# Patient Record
Sex: Female | Born: 1975 | Race: Black or African American | Hispanic: No | Marital: Single | Smoking: Former smoker
Health system: Southern US, Community
[De-identification: ages and names within clinical notes are randomized; demographics above are authoritative.]

## PROBLEM LIST (undated history)

## (undated) ENCOUNTER — Emergency Department (HOSPITAL_COMMUNITY): Discharge status: Absent without leave | Payer: Self-pay

## (undated) DIAGNOSIS — A5909 Other urogenital trichomoniasis: Secondary | ICD-10-CM

## (undated) DIAGNOSIS — O98319 Other infections with a predominantly sexual mode of transmission complicating pregnancy, unspecified trimester: Secondary | ICD-10-CM

## (undated) DIAGNOSIS — N390 Urinary tract infection, site not specified: Secondary | ICD-10-CM

## (undated) DIAGNOSIS — F329 Major depressive disorder, single episode, unspecified: Secondary | ICD-10-CM

## (undated) DIAGNOSIS — B9689 Other specified bacterial agents as the cause of diseases classified elsewhere: Secondary | ICD-10-CM

## (undated) DIAGNOSIS — N760 Acute vaginitis: Secondary | ICD-10-CM

## (undated) DIAGNOSIS — E049 Nontoxic goiter, unspecified: Secondary | ICD-10-CM

## (undated) DIAGNOSIS — F32A Depression, unspecified: Secondary | ICD-10-CM

## (undated) DIAGNOSIS — T7840XA Allergy, unspecified, initial encounter: Secondary | ICD-10-CM

## (undated) DIAGNOSIS — A568 Sexually transmitted chlamydial infection of other sites: Secondary | ICD-10-CM

## (undated) HISTORY — DX: Depression, unspecified: F32.A

## (undated) HISTORY — DX: Allergy, unspecified, initial encounter: T78.40XA

## (undated) HISTORY — DX: Major depressive disorder, single episode, unspecified: F32.9

---

## 1997-10-02 ENCOUNTER — Inpatient Hospital Stay (HOSPITAL_COMMUNITY): Admission: AD | Admit: 1997-10-02 | Discharge: 1997-10-02 | Payer: Self-pay | Admitting: Obstetrics & Gynecology

## 1999-07-13 ENCOUNTER — Inpatient Hospital Stay (HOSPITAL_COMMUNITY): Admission: AD | Admit: 1999-07-13 | Discharge: 1999-07-13 | Payer: Self-pay | Admitting: Pediatrics

## 2000-06-27 ENCOUNTER — Inpatient Hospital Stay (HOSPITAL_COMMUNITY): Admission: AD | Admit: 2000-06-27 | Discharge: 2000-06-27 | Payer: Self-pay | Admitting: *Deleted

## 2000-09-27 ENCOUNTER — Inpatient Hospital Stay (HOSPITAL_COMMUNITY): Admission: AD | Admit: 2000-09-27 | Discharge: 2000-09-27 | Payer: Self-pay | Admitting: Obstetrics

## 2000-11-30 ENCOUNTER — Ambulatory Visit (HOSPITAL_COMMUNITY): Admission: RE | Admit: 2000-11-30 | Discharge: 2000-11-30 | Payer: Self-pay | Admitting: *Deleted

## 2000-12-17 ENCOUNTER — Inpatient Hospital Stay (HOSPITAL_COMMUNITY): Admission: AD | Admit: 2000-12-17 | Discharge: 2000-12-17 | Payer: Self-pay | Admitting: *Deleted

## 2000-12-26 ENCOUNTER — Ambulatory Visit (HOSPITAL_COMMUNITY): Admission: RE | Admit: 2000-12-26 | Discharge: 2000-12-26 | Payer: Self-pay | Admitting: Obstetrics

## 2001-01-31 ENCOUNTER — Inpatient Hospital Stay (HOSPITAL_COMMUNITY): Admission: AD | Admit: 2001-01-31 | Discharge: 2001-01-31 | Payer: Self-pay | Admitting: Obstetrics

## 2001-01-31 ENCOUNTER — Encounter: Payer: Self-pay | Admitting: Obstetrics

## 2001-02-01 ENCOUNTER — Encounter (INDEPENDENT_AMBULATORY_CARE_PROVIDER_SITE_OTHER): Payer: Self-pay

## 2001-02-01 ENCOUNTER — Inpatient Hospital Stay (HOSPITAL_COMMUNITY): Admission: AD | Admit: 2001-02-01 | Discharge: 2001-02-03 | Payer: Self-pay | Admitting: Obstetrics & Gynecology

## 2001-02-02 HISTORY — PX: TUBAL LIGATION: SHX77

## 2001-12-26 ENCOUNTER — Inpatient Hospital Stay (HOSPITAL_COMMUNITY): Admission: AD | Admit: 2001-12-26 | Discharge: 2001-12-26 | Payer: Self-pay | Admitting: Obstetrics and Gynecology

## 2002-04-10 ENCOUNTER — Emergency Department (HOSPITAL_COMMUNITY): Admission: EM | Admit: 2002-04-10 | Discharge: 2002-04-10 | Payer: Self-pay | Admitting: Emergency Medicine

## 2002-09-08 ENCOUNTER — Emergency Department (HOSPITAL_COMMUNITY): Admission: EM | Admit: 2002-09-08 | Discharge: 2002-09-08 | Payer: Self-pay | Admitting: Emergency Medicine

## 2002-12-10 ENCOUNTER — Emergency Department (HOSPITAL_COMMUNITY): Admission: EM | Admit: 2002-12-10 | Discharge: 2002-12-10 | Payer: Self-pay

## 2002-12-30 ENCOUNTER — Inpatient Hospital Stay (HOSPITAL_COMMUNITY): Admission: AD | Admit: 2002-12-30 | Discharge: 2002-12-30 | Payer: Self-pay | Admitting: Obstetrics and Gynecology

## 2003-05-07 ENCOUNTER — Inpatient Hospital Stay (HOSPITAL_COMMUNITY): Admission: AD | Admit: 2003-05-07 | Discharge: 2003-05-07 | Payer: Self-pay | Admitting: Obstetrics and Gynecology

## 2003-05-16 ENCOUNTER — Encounter: Admission: RE | Admit: 2003-05-16 | Discharge: 2003-05-16 | Payer: Self-pay | Admitting: Family Medicine

## 2003-07-11 ENCOUNTER — Encounter: Admission: RE | Admit: 2003-07-11 | Discharge: 2003-07-11 | Payer: Self-pay | Admitting: Family Medicine

## 2003-08-05 ENCOUNTER — Encounter: Admission: RE | Admit: 2003-08-05 | Discharge: 2003-08-05 | Payer: Self-pay | Admitting: Pediatrics

## 2003-09-04 ENCOUNTER — Inpatient Hospital Stay (HOSPITAL_COMMUNITY): Admission: AD | Admit: 2003-09-04 | Discharge: 2003-09-04 | Payer: Self-pay | Admitting: Obstetrics & Gynecology

## 2004-02-24 ENCOUNTER — Ambulatory Visit: Payer: Self-pay | Admitting: Family Medicine

## 2004-06-10 ENCOUNTER — Ambulatory Visit: Payer: Self-pay | Admitting: Family Medicine

## 2004-06-10 ENCOUNTER — Other Ambulatory Visit: Admission: RE | Admit: 2004-06-10 | Discharge: 2004-06-10 | Payer: Self-pay | Admitting: Family Medicine

## 2004-06-26 ENCOUNTER — Emergency Department (HOSPITAL_COMMUNITY): Admission: EM | Admit: 2004-06-26 | Discharge: 2004-06-26 | Payer: Self-pay | Admitting: Family Medicine

## 2005-11-19 ENCOUNTER — Emergency Department (HOSPITAL_COMMUNITY): Admission: EM | Admit: 2005-11-19 | Discharge: 2005-11-19 | Payer: Self-pay | Admitting: Emergency Medicine

## 2006-01-30 ENCOUNTER — Encounter (INDEPENDENT_AMBULATORY_CARE_PROVIDER_SITE_OTHER): Payer: Self-pay | Admitting: *Deleted

## 2006-01-30 LAB — CONVERTED CEMR LAB

## 2006-02-27 ENCOUNTER — Ambulatory Visit: Payer: Self-pay | Admitting: Family Medicine

## 2006-03-08 ENCOUNTER — Encounter: Admission: RE | Admit: 2006-03-08 | Discharge: 2006-03-08 | Payer: Self-pay | Admitting: Sports Medicine

## 2006-03-17 ENCOUNTER — Encounter: Admission: RE | Admit: 2006-03-17 | Discharge: 2006-03-17 | Payer: Self-pay | Admitting: Sports Medicine

## 2006-03-20 ENCOUNTER — Ambulatory Visit: Payer: Self-pay | Admitting: Sports Medicine

## 2006-05-16 ENCOUNTER — Ambulatory Visit: Payer: Self-pay | Admitting: Family Medicine

## 2006-06-29 DIAGNOSIS — R12 Heartburn: Secondary | ICD-10-CM | POA: Insufficient documentation

## 2006-06-29 DIAGNOSIS — F17200 Nicotine dependence, unspecified, uncomplicated: Secondary | ICD-10-CM | POA: Insufficient documentation

## 2006-06-29 DIAGNOSIS — E049 Nontoxic goiter, unspecified: Secondary | ICD-10-CM | POA: Insufficient documentation

## 2006-06-30 ENCOUNTER — Encounter (INDEPENDENT_AMBULATORY_CARE_PROVIDER_SITE_OTHER): Payer: Self-pay | Admitting: *Deleted

## 2006-08-04 ENCOUNTER — Telehealth: Payer: Self-pay | Admitting: *Deleted

## 2006-08-08 ENCOUNTER — Ambulatory Visit: Payer: Self-pay | Admitting: Family Medicine

## 2006-08-08 ENCOUNTER — Encounter (INDEPENDENT_AMBULATORY_CARE_PROVIDER_SITE_OTHER): Payer: Self-pay | Admitting: Family Medicine

## 2006-08-08 DIAGNOSIS — N898 Other specified noninflammatory disorders of vagina: Secondary | ICD-10-CM

## 2006-08-08 LAB — CONVERTED CEMR LAB
Chlamydia, DNA Probe: NEGATIVE
GC Probe Amp, Genital: NEGATIVE

## 2006-09-06 ENCOUNTER — Encounter (INDEPENDENT_AMBULATORY_CARE_PROVIDER_SITE_OTHER): Payer: Self-pay | Admitting: Family Medicine

## 2006-09-06 ENCOUNTER — Ambulatory Visit: Payer: Self-pay | Admitting: Family Medicine

## 2006-09-06 DIAGNOSIS — J301 Allergic rhinitis due to pollen: Secondary | ICD-10-CM | POA: Insufficient documentation

## 2006-09-06 LAB — CONVERTED CEMR LAB
Bilirubin Urine: NEGATIVE
Blood in Urine, dipstick: NEGATIVE
Glucose, Urine, Semiquant: NEGATIVE
Ketones, urine, test strip: NEGATIVE
Nitrite: NEGATIVE
Specific Gravity, Urine: >=1.03
Urobilinogen, UA: 0.2
WBC Urine, dipstick: NEGATIVE
Whiff Test: NEGATIVE
pH: 6

## 2006-09-07 ENCOUNTER — Telehealth (INDEPENDENT_AMBULATORY_CARE_PROVIDER_SITE_OTHER): Payer: Self-pay | Admitting: *Deleted

## 2006-10-05 ENCOUNTER — Encounter (INDEPENDENT_AMBULATORY_CARE_PROVIDER_SITE_OTHER): Payer: Self-pay | Admitting: Interventional Radiology

## 2006-10-05 ENCOUNTER — Other Ambulatory Visit: Admission: RE | Admit: 2006-10-05 | Discharge: 2006-10-05 | Payer: Self-pay | Admitting: Interventional Radiology

## 2006-10-05 ENCOUNTER — Encounter: Admission: RE | Admit: 2006-10-05 | Discharge: 2006-10-05 | Payer: Self-pay | Admitting: Internal Medicine

## 2006-10-11 ENCOUNTER — Ambulatory Visit: Payer: Self-pay | Admitting: Sports Medicine

## 2006-11-09 ENCOUNTER — Encounter (INDEPENDENT_AMBULATORY_CARE_PROVIDER_SITE_OTHER): Payer: Self-pay | Admitting: Family Medicine

## 2006-11-13 ENCOUNTER — Ambulatory Visit: Payer: Self-pay | Admitting: Family Medicine

## 2006-11-15 ENCOUNTER — Ambulatory Visit: Payer: Self-pay | Admitting: Family Medicine

## 2007-05-14 ENCOUNTER — Ambulatory Visit: Payer: Self-pay | Admitting: Sports Medicine

## 2007-05-14 LAB — CONVERTED CEMR LAB: Rapid Strep: NEGATIVE

## 2007-12-24 ENCOUNTER — Telehealth (INDEPENDENT_AMBULATORY_CARE_PROVIDER_SITE_OTHER): Payer: Self-pay | Admitting: *Deleted

## 2007-12-25 ENCOUNTER — Other Ambulatory Visit: Admission: RE | Admit: 2007-12-25 | Discharge: 2007-12-25 | Payer: Self-pay | Admitting: Family Medicine

## 2007-12-25 ENCOUNTER — Ambulatory Visit: Payer: Self-pay | Admitting: Family Medicine

## 2007-12-25 LAB — CONVERTED CEMR LAB
Pap Smear: NORMAL
Whiff Test: POSITIVE

## 2007-12-27 ENCOUNTER — Encounter: Payer: Self-pay | Admitting: Family Medicine

## 2007-12-27 ENCOUNTER — Ambulatory Visit: Payer: Self-pay | Admitting: Family Medicine

## 2007-12-28 ENCOUNTER — Encounter (INDEPENDENT_AMBULATORY_CARE_PROVIDER_SITE_OTHER): Payer: Self-pay | Admitting: *Deleted

## 2008-03-17 ENCOUNTER — Telehealth: Payer: Self-pay | Admitting: *Deleted

## 2008-03-17 ENCOUNTER — Ambulatory Visit: Payer: Self-pay | Admitting: Family Medicine

## 2008-03-17 ENCOUNTER — Emergency Department (HOSPITAL_COMMUNITY): Admission: EM | Admit: 2008-03-17 | Discharge: 2008-03-17 | Payer: Self-pay | Admitting: Emergency Medicine

## 2008-03-17 LAB — CONVERTED CEMR LAB: Rapid Strep: NEGATIVE

## 2008-08-19 ENCOUNTER — Ambulatory Visit: Payer: Self-pay | Admitting: Family Medicine

## 2008-08-19 ENCOUNTER — Telehealth: Payer: Self-pay | Admitting: Family Medicine

## 2008-08-19 LAB — CONVERTED CEMR LAB: Whiff Test: NEGATIVE

## 2008-08-28 ENCOUNTER — Telehealth: Payer: Self-pay | Admitting: *Deleted

## 2008-09-25 ENCOUNTER — Telehealth: Payer: Self-pay | Admitting: Family Medicine

## 2010-03-06 ENCOUNTER — Emergency Department (HOSPITAL_COMMUNITY): Admission: EM | Admit: 2010-03-06 | Discharge: 2010-03-06 | Payer: Self-pay | Admitting: Family Medicine

## 2010-05-13 ENCOUNTER — Emergency Department (HOSPITAL_COMMUNITY)
Admission: EM | Admit: 2010-05-13 | Discharge: 2010-05-13 | Payer: Self-pay | Source: Home / Self Care | Admitting: Family Medicine

## 2010-05-17 LAB — URINE CULTURE
Colony Count: >=100000
Culture  Setup Time: 201201122126

## 2010-05-17 LAB — WET PREP, GENITAL: Yeast Wet Prep HPF POC: NONE SEEN

## 2010-05-17 LAB — POCT URINALYSIS DIPSTICK
Bilirubin Urine: NEGATIVE
Hgb urine dipstick: NEGATIVE
Ketones, ur: 15 mg/dL — AB
Nitrite: POSITIVE — AB
Protein, ur: NEGATIVE mg/dL
Specific Gravity, Urine: 1.025 (ref 1.005–1.030)
Urine Glucose, Fasting: NEGATIVE mg/dL
Urobilinogen, UA: 0.2 mg/dL (ref 0.0–1.0)
pH: 5.5 (ref 5.0–8.0)

## 2010-05-17 LAB — GC/CHLAMYDIA PROBE AMP, GENITAL
Chlamydia, DNA Probe: NEGATIVE
GC Probe Amp, Genital: NEGATIVE

## 2010-05-17 LAB — POCT PREGNANCY, URINE: Preg Test, Ur: NEGATIVE

## 2010-07-13 LAB — POCT URINALYSIS DIPSTICK
Bilirubin Urine: NEGATIVE
Glucose, UA: NEGATIVE mg/dL
Hgb urine dipstick: NEGATIVE
Ketones, ur: NEGATIVE mg/dL
Nitrite: NEGATIVE
Protein, ur: NEGATIVE mg/dL
Specific Gravity, Urine: 1.02 (ref 1.005–1.030)
Urobilinogen, UA: 0.2 mg/dL (ref 0.0–1.0)
pH: 6.5 (ref 5.0–8.0)

## 2010-07-13 LAB — GC/CHLAMYDIA PROBE AMP, GENITAL
Chlamydia, DNA Probe: NEGATIVE
GC Probe Amp, Genital: NEGATIVE

## 2010-07-13 LAB — WET PREP, GENITAL
Trich, Wet Prep: NONE SEEN
Yeast Wet Prep HPF POC: NONE SEEN

## 2010-07-13 LAB — POCT PREGNANCY, URINE: Preg Test, Ur: NEGATIVE

## 2010-09-17 NOTE — Op Note (Signed)
[facility]  Patient:    Kaitlyn Rasmussen, Kaitlyn Rasmussen Visit Number: [id number] MRN: [id number]          Service Type: OBS Location: 910A [id number] [id number] Attending Physician:  Antionette Char Proc. Date: 01/31/01 Admit Date:  02/01/2001 Discharge Date: 02/03/2001                             Operative Report  PREOPERATIVE DIAGNOSIS:       Desire for surgical sterilization.  POSTOPERATIVE DIAGNOSIS:      Desire for surgical sterilization.  OPERATION:                    Modified bilateral [place] tubal ligation.  SURGEON:                      Conni Elliot, M.D.  ASSISTANT:                    Ed Blalock. Burnadette Peter, M.D.  ANESTHESIA:                   Continuous lumbar epidural.  DESCRIPTION OF PROCEDURE:     After bringing continuous lumbar epidural anesthetic to an operative level, the patient supine; the abdomen was prepped and draped in a sterile fashion.  A periumbilical incision was made and the incision made through the skin and the layers of fascia.  The peritoneal cavity was entered.  The right fallopian tube was identified, grasped with a Babcock clamp, followed to its fimbriated end.  A segment of tube was brought into the operative field, doubly suture ligated, approximately 1.5-2 cm segment was excised.  Hemostasis adequate.  A similar procedure was done on the left.  Hemostasis was again adequate.  Anterior peritoneum, fascia, subcu, and skin were closed in routine fashion.  Estimated blood loss less than 10 cc.  Instrument counts correct. Attending Physician:  Antionette Char DD:  02/22/01 TD:  02/24/01 Job: [id number]

## 2011-03-04 ENCOUNTER — Inpatient Hospital Stay (INDEPENDENT_AMBULATORY_CARE_PROVIDER_SITE_OTHER)
Admission: RE | Admit: 2011-03-04 | Discharge: 2011-03-04 | Disposition: A | Discharge status: Home / Self Care | Payer: Medicaid Other | Source: Ambulatory Visit | Attending: Family Medicine | Admitting: Family Medicine

## 2011-03-04 DIAGNOSIS — A5901 Trichomonal vulvovaginitis: Secondary | ICD-10-CM

## 2011-03-04 LAB — POCT URINALYSIS DIP (DEVICE)
Bilirubin Urine: NEGATIVE
Glucose, UA: NEGATIVE mg/dL
Hgb urine dipstick: NEGATIVE
Ketones, ur: NEGATIVE mg/dL
Leukocytes, UA: NEGATIVE
Nitrite: NEGATIVE
Protein, ur: NEGATIVE mg/dL
Specific Gravity, Urine: 1.02 (ref 1.005–1.030)
Urobilinogen, UA: 0.2 mg/dL (ref 0.0–1.0)
pH: 7 (ref 5.0–8.0)

## 2011-03-04 LAB — POCT PREGNANCY, URINE: Preg Test, Ur: NEGATIVE

## 2011-03-04 LAB — WET PREP, GENITAL: Trich, Wet Prep: NONE SEEN

## 2011-03-05 LAB — GC/CHLAMYDIA PROBE AMP, GENITAL
Chlamydia, DNA Probe: NEGATIVE
GC Probe Amp, Genital: NEGATIVE

## 2011-03-07 NOTE — ED Notes (Signed)
GC neg., Chlamydia neg., Wet prep: many clue cells, few WBC's.  Pt. adequately treated with Flagyl.

## 2011-07-22 ENCOUNTER — Encounter (HOSPITAL_COMMUNITY): Payer: Self-pay

## 2011-07-22 ENCOUNTER — Emergency Department (HOSPITAL_COMMUNITY)
Admission: EM | Admit: 2011-07-22 | Discharge: 2011-07-22 | Disposition: A | Discharge status: Home / Self Care | Payer: Medicaid Other | Source: Home / Self Care | Attending: Family Medicine | Admitting: Family Medicine

## 2011-07-22 DIAGNOSIS — N760 Acute vaginitis: Secondary | ICD-10-CM

## 2011-07-22 DIAGNOSIS — M278 Other specified diseases of jaws: Secondary | ICD-10-CM

## 2011-07-22 DIAGNOSIS — M270 Developmental disorders of jaws: Secondary | ICD-10-CM

## 2011-07-22 DIAGNOSIS — M545 Low back pain: Secondary | ICD-10-CM

## 2011-07-22 LAB — POCT URINALYSIS DIP (DEVICE)
Glucose, UA: NEGATIVE mg/dL
Ketones, ur: 40 mg/dL — AB
Protein, ur: 100 mg/dL — AB
Urobilinogen, UA: 0.2 mg/dL (ref 0.0–1.0)

## 2011-07-22 LAB — WET PREP, GENITAL: Yeast Wet Prep HPF POC: NONE SEEN

## 2011-07-22 MED ORDER — METRONIDAZOLE 500 MG PO TABS: 500.0000 mg | ORAL_TABLET | Freq: Two times a day (BID) | ORAL | Status: AC

## 2011-07-22 MED ORDER — IBUPROFEN 600 MG PO TABS: 600.0000 mg | ORAL_TABLET | Freq: Three times a day (TID) | ORAL | Status: AC | PRN

## 2011-07-22 MED ORDER — FLUCONAZOLE 150 MG PO TABS: ORAL_TABLET | ORAL | Status: DC

## 2011-07-22 NOTE — Discharge Instructions (Signed)
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  Usually just a normal finding. Can read about it over the Internet. No needed treatment unless growing and frequent ulceration.  Take the prescribed medications as instructed. Can do back exercises as per her handout. We will contact you if any abnormal results of the pending tests. Return if worsening pain or symptoms despite following treatment.   Back Exercises Back exercises help treat and prevent back injuries. The goal of back exercises is to increase the strength of your abdominal and back muscles and the flexibility of your back. These exercises should be started when you no longer have back pain. Back exercises include:  Pelvic Tilt. Lie on your back with your knees bent. Tilt your pelvis until the lower part of your back is against the floor. Hold this position 5 to 10 sec and repeat 5 to 10 times.   Knee to Chest. Pull first 1 knee up against your chest and hold for 20 to 30 seconds, repeat this with the other knee, and then both knees. This may be done with the other leg straight or bent, whichever feels better.   Sit-Ups or Curl-Ups. Bend your knees 90 degrees. Start with tilting your pelvis, and do a partial, slow sit-up, lifting your trunk only 30 to 45 degrees off the floor. Take at least 2 to 3 seconds for each sit-up. Do not do sit-ups with your knees out straight. If partial sit-ups are difficult, simply do the above but with only tightening your abdominal muscles and holding it as directed.   Hip-Lift. Lie on your back with your knees flexed 90 degrees. Push down with your feet and shoulders as you raise your hips a couple inches off the floor; hold for 10 seconds, repeat 5 to 10 times.   Back arches. Lie on your stomach, propping yourself up on bent elbows. Slowly press on your hands, causing an arch in your low back. Repeat 3 to 5 times. Any initial stiffness and discomfort should lessen with repetition over time.    Shoulder-Lifts. Lie face down with arms beside your body. Keep hips and torso pressed to floor as you slowly lift your head and shoulders off the floor.  Do not overdo your exercises, especially in the beginning. Exercises may cause you some mild back discomfort which lasts for a few minutes; however, if the pain is more severe, or lasts for more than 15 minutes, do not continue exercises until you see your caregiver. Improvement with exercise therapy for back problems is slow.  See your caregivers for assistance with developing a proper back exercise program. Document Released: 05/26/2004 Document Revised: 04/07/2011 Document Reviewed: 04/18/2005 [facility] Patient Information 2012 [place], [place].Back Exercises Back exercises help treat and prevent back injuries. The goal of back exercises is to increase the strength of your abdominal and back muscles and the flexibility of your back. These exercises should be started when you no longer have back pain. Back exercises include:  Pelvic Tilt. Lie on your back with your knees bent. Tilt your pelvis until the lower part of your back is against the floor. Hold this position 5 to 10 sec and repeat 5 to 10 times.   Knee to Chest. Pull first 1 knee up against your chest and hold for 20 to 30 seconds, repeat this with the other knee, and then both knees. This may be done with the other leg straight or bent, whichever feels better.   Sit-Ups or Curl-Ups. [employer]  your knees 90 degrees. Start with tilting your pelvis, and do a partial, slow sit-up, lifting your trunk only 30 to 45 degrees off the floor. Take at least 2 to 3 seconds for each sit-up. Do not do sit-ups with your knees out straight. If partial sit-ups are difficult, simply do the above but with only tightening your abdominal muscles and holding it as directed.   Hip-Lift. Lie on your back with your knees flexed 90 degrees. Push down with your feet and shoulders as you raise your hips a couple inches  off the floor; hold for 10 seconds, repeat 5 to 10 times.   Back arches. Lie on your stomach, propping yourself up on bent elbows. Slowly press on your hands, causing an arch in your low back. Repeat 3 to 5 times. Any initial stiffness and discomfort should lessen with repetition over time.   Shoulder-Lifts. Lie face down with arms beside your body. Keep hips and torso pressed to floor as you slowly lift your head and shoulders off the floor.  Do not overdo your exercises, especially in the beginning. Exercises may cause you some mild back discomfort which lasts for a few minutes; however, if the pain is more severe, or lasts for more than 15 minutes, do not continue exercises until you see your caregiver. Improvement with exercise therapy for back problems is slow.  See your caregivers for assistance with developing a proper back exercise program. Document Released: 05/26/2004 Document Revised: 04/07/2011 Document Reviewed: 04/18/2005 [facility] Patient Information 2012 [place], [place].

## 2011-07-22 NOTE — ED Notes (Signed)
Patient c/o of swelling of upper palate that seemed to occur 2 weeks ago. Patient states that she has no pain but seems to be bothersome at times. Lower back pain started today. Pain level 7 on scale of (0-10).  Patient also states that her urine has a strong odor.

## 2011-07-23 LAB — GC/CHLAMYDIA PROBE AMP, GENITAL: GC Probe Amp, Genital: NEGATIVE

## 2011-07-23 NOTE — ED Provider Notes (Signed)
History     CSN: [id number]  Arrival date & time 07/22/11  Kaitlyn Rasmussen   First MD Initiated Contact with Patient 07/22/11 1953      Chief Complaint  Patient presents with  . Oral Swelling    (Consider location/radiation/quality/duration/timing/severity/associated sxs/prior treatment) HPI Comments: 36 y/o smoker female with multiple unrelated complaints as follow: 1) just noticed a hard bump in her palate. Present for long time but just got irritated 2 weeks ago after eating a hot food. No pain or irritation currently but the bump still there and wants it to have it checked. 2) low back pain. Has had recurrent low back pain on and off exacerbation started today both sides no radiation worse with movement. No pain with urination fever or chills but has strong urine odor.  3) vaginal discharge fish smell for several weeks associated with itchiness. Last MP first week of march normal. S/p BTL.    History reviewed. No pertinent past medical history.  Past Surgical History  Procedure Date  . Tubal ligation procedure done 10 years ago    History reviewed. No pertinent family history.  History  Substance Use Topics  . Smoking status: Current Everyday Smoker -- 0.5 packs/day    Types: Cigarettes  . Smokeless tobacco: Not on file  . Alcohol Use: Yes     occasional/social    OB History    Grav Para Term Preterm Abortions TAB SAB Ect Mult Living                  Review of Systems  Constitutional: Negative for fever, chills and appetite change.  Gastrointestinal: Negative for nausea, vomiting, abdominal pain and diarrhea.  Genitourinary: Positive for vaginal discharge. Negative for dysuria, flank pain, menstrual problem and pelvic pain.  Musculoskeletal: Positive for back pain.  Skin: Negative for rash.    Allergies  Review of patient's allergies indicates no known allergies.  Home Medications   Current Outpatient Rx  Name Route Sig Dispense Refill  . FLUCONAZOLE 150 MG PO  TABS  1 tab po now then repeat in 1 week. 2 tablet 0  . IBUPROFEN 600 MG PO TABS Oral Take 1 tablet (600 mg total) by mouth every 8 (eight) hours as needed for pain. 20 tablet 0  . METRONIDAZOLE 500 MG PO TABS Oral Take 1 tablet (500 mg total) by mouth 2 (two) times daily. 14 tablet 0    BP 150/72  Pulse 83  Temp(Src) 98.7 F (37.1 C) (Oral)  Resp 16  SpO2 100%  LMP 07/04/2011  Physical Exam  Nursing note and vitals reviewed. Constitutional: She is oriented to person, place, and time. She appears well-developed and well-nourished. No distress.  HENT:  Head: Normocephalic and atraumatic.  Mouth/Throat: Oropharynx is clear and moist. No oropharyngeal exudate.       Observed torus palatinus with no associated inflammation or ulcerations. Normal mucosa above.   Cardiovascular: Normal heart sounds.   Pulmonary/Chest: Breath sounds normal.  Abdominal: Soft. There is no tenderness.       No CVT  Genitourinary: Uterus normal. There is no rash on the right labia. There is no rash on the left labia. Cervix exhibits no motion tenderness and no friability. Right adnexum displays no mass, no tenderness and no fullness. Left adnexum displays no mass, no tenderness and no fullness. There is erythema around the vagina. No bleeding around the vagina. Vaginal discharge found.  Musculoskeletal:       Tenderness to palpation in lower paravertebral muscles  bilateral. Worse with anterior lumbar flexion and posterior extension. Negative straight leg test.  Lower extremities neurovascularly intact.  Lymphadenopathy:       Right: No inguinal adenopathy present.       Left: No inguinal adenopathy present.  Neurological: She is alert and oriented to person, place, and time.  Skin: No rash noted.    ED Course  Procedures (including critical care time)  Labs Reviewed  POCT URINALYSIS DIP (DEVICE) - Abnormal; Notable for the following:    Bilirubin Urine SMALL (*)    Ketones, ur 40 (*)    Hgb urine  dipstick MODERATE (*)    Protein, ur 100 (*)    Leukocytes, UA SMALL (*) Biochemical Testing Only. Please order routine urinalysis from main lab if confirmatory testing is needed.   All other components within normal limits  WET PREP, GENITAL - Abnormal; Notable for the following:    Clue Cells Wet Prep HPF POC MANY (*)    All other components within normal limits  GC/CHLAMYDIA PROBE AMP, GENITAL  POCT PREGNANCY, URINE   No results found.   1. Torus palatinus   2. Vaginitis   3. Low back pain       MDM  Treated with flagyl. diflucan and ibuprofen. No much value of poc UA results in the presence of vaginal discharge and no urinary symptoms.        Sharin Grave, MD 07/23/11 1426

## 2011-07-25 NOTE — ED Notes (Signed)
GC/Chlamydia neg., Wet prep: Many clue cells. Pt. adequately treated with Flagyl. Vassie Moselle 07/25/2011

## 2011-08-04 ENCOUNTER — Telehealth (HOSPITAL_COMMUNITY): Payer: Self-pay | Admitting: *Deleted

## 2011-08-04 NOTE — ED Notes (Signed)
Pt. called for her lab results. I asked her if they told her we would call if anything was positive. She said yes but her phone number has been changed. Pt. verified x 2 and given results. I asked if she filled her Rx. of Flagyl and she said yes. Pt. states she asked them to check her urine. I told her a urine culture was not done. She said now she feels like burning when passes her urine. I told she would need to come back and be rechecked for this. Vassie Moselle 08/04/2011

## 2011-08-16 ENCOUNTER — Emergency Department (HOSPITAL_COMMUNITY)
Admission: EM | Admit: 2011-08-16 | Discharge: 2011-08-16 | Disposition: A | Discharge status: Home / Self Care | Payer: Medicaid Other | Source: Home / Self Care | Attending: Emergency Medicine | Admitting: Emergency Medicine

## 2011-08-16 ENCOUNTER — Encounter (HOSPITAL_COMMUNITY): Payer: Self-pay | Admitting: Emergency Medicine

## 2011-08-16 DIAGNOSIS — N390 Urinary tract infection, site not specified: Secondary | ICD-10-CM

## 2011-08-16 HISTORY — DX: Urinary tract infection, site not specified: N39.0

## 2011-08-16 HISTORY — DX: Other urogenital trichomoniasis: A59.09

## 2011-08-16 HISTORY — DX: Other specified bacterial agents as the cause of diseases classified elsewhere: B96.89

## 2011-08-16 HISTORY — DX: Acute vaginitis: N76.0

## 2011-08-16 LAB — POCT URINALYSIS DIP (DEVICE)
Bilirubin Urine: NEGATIVE
Glucose, UA: NEGATIVE mg/dL
Hgb urine dipstick: NEGATIVE
Specific Gravity, Urine: >=1.03 (ref 1.005–1.030)

## 2011-08-16 MED ORDER — NITROFURANTOIN MONOHYD MACRO 100 MG PO CAPS: 100.0000 mg | ORAL_CAPSULE | Freq: Two times a day (BID) | ORAL | Status: AC

## 2011-08-16 NOTE — Discharge Instructions (Signed)
Drink at least 2 liters of water a day. Drink more if you are exercising. Take the medication as written. Return if you get worse, have a  fever >100.4, or for any concerns.   Go to www.goodrx.com to look up your medications. This will give you a list of where you can find your prescriptions at the most affordable prices.   Go to www.goodrx.com to look up your medications. This will give you a list of where you can find your prescriptions at the most affordable prices.   RESOURCE GUIDE  No Primary Care Doctor Call [facility]  [phone] Other agencies that provide inexpensive medical care    [facility]  [phone]    [facility]  [phone]    [facility]  [phone]    [facility]  [phone] [address] [place] [address]    [facility]  [phone]    [facility]  [phone] [facility] [phone]   [address], [place] [place], [place] [address]  [facility] Resources  [facility]     [employer]                          [facility] Dept. [address] [place]                       [address]      [address] [place] [address]   [phone] (After Hours)

## 2011-08-16 NOTE — ED Provider Notes (Signed)
History     CSN: [id number]  Arrival date & time 08/16/11  4742   First MD Initiated Contact with Patient 08/16/11 2003      Chief Complaint  Patient presents with  . Urinary Frequency    (Consider location/radiation/quality/duration/timing/severity/associated sxs/prior treatment) HPI Comments: Patient with malodorous urine for a month. Occasional urgency, frequency. No dysuria. No cloudy urine, hematuria. No vaginal bleeding or discharge. Patient states that she's not been sexually active since she was treated for BV several weeks ago. States she finished all the Flagyl. No nausea, vomiting, abdominal pain, fevers, back pain. States that she is drinking "a lot" of water, but is exercising frequently. Patient denies any herbal supplements. There no aggravating or alleviating factors. Patient is concerned that she may have an Escherichia coli UTI, as her urine smells similar to when she had her last UTI. History of BV. Trichomonas in January 2012. Escherichia coli UTI. Yeast, Gonorrhea, Chlamydia negative multiple times since 2011.  ROS as noted in HPI. All other ROS negative.   Patient is a 36 y.o. female presenting with dysuria. The history is provided by the patient.  Dysuria     Past Medical History  Diagnosis Date  . BV (bacterial vaginosis)   . Trichomonal Cervicitis   . UTI (lower urinary tract infection)     Past Surgical History  Procedure Date  . Tubal ligation procedure done 10 years ago    History reviewed. No pertinent family history.  History  Substance Use Topics  . Smoking status: Current Everyday Smoker -- 0.5 packs/day    Types: Cigarettes  . Smokeless tobacco: Not on file  . Alcohol Use: Yes     occasional/social    OB History    Grav Para Term Preterm Abortions TAB SAB Ect Mult Living                  Review of Systems  Genitourinary: Positive for dysuria.    Allergies  Review of patient's allergies indicates no known allergies.  Home  Medications   Current Outpatient Rx  Name Route Sig Dispense Refill  . NITROFURANTOIN MONOHYD MACRO 100 MG PO CAPS Oral Take 1 capsule (100 mg total) by mouth 2 (two) times daily. X 7 days 14 capsule 0    LMP 08/02/2011  Physical Exam  Nursing note and vitals reviewed. Constitutional: She is oriented to person, place, and time. She appears well-developed and well-nourished. No distress.  HENT:  Head: Normocephalic and atraumatic.  Eyes: Conjunctivae and EOM are normal.  Neck: Normal range of motion.  Cardiovascular: Normal rate, regular rhythm and normal heart sounds.   Pulmonary/Chest: Effort normal and breath sounds normal.  Abdominal: Soft. Bowel sounds are normal. She exhibits no distension. There is no tenderness. There is no rebound and no guarding.       No CVA tenderness  Musculoskeletal: Normal range of motion.  Neurological: She is alert and oriented to person, place, and time.  Skin: Skin is warm and dry.  Psychiatric: She has a normal mood and affect. Her behavior is normal. Judgment and thought content normal.    ED Course  Procedures (including critical care time)  Labs Reviewed  POCT URINALYSIS DIP (DEVICE) - Abnormal; Notable for the following:    Ketones, ur 15 (*)    Protein, ur 30 (*)    Leukocytes, UA SMALL (*) Biochemical Testing Only. Please order routine urinalysis from main lab if confirmatory testing is needed.   All other components within normal  limits  POCT PREGNANCY, URINE   No results found.   1. UTI (lower urinary tract infection)    Results for orders placed during the hospital encounter of 08/16/11  POCT URINALYSIS DIP (DEVICE)      Component Value Range   Glucose, UA NEGATIVE  NEGATIVE (mg/dL)   Bilirubin Urine NEGATIVE  NEGATIVE    Ketones, ur 15 (*) NEGATIVE (mg/dL)   Specific Gravity, Urine >=1.030  1.005 - 1.030    Hgb urine dipstick NEGATIVE  NEGATIVE    pH 5.5  5.0 - 8.0    Protein, ur 30 (*) NEGATIVE (mg/dL)   Urobilinogen,  UA 0.2  0.0 - 1.0 (mg/dL)   Nitrite NEGATIVE  NEGATIVE    Leukocytes, UA SMALL (*) NEGATIVE   POCT PREGNANCY, URINE      Component Value Range   Preg Test, Ur NEGATIVE  NEGATIVE       MDM  Previous records, labs reviewed patient was seen 3/22 /2013, found to have BV. As noted in history of present illness  Discussed with patient that she needs to be drinking at least 2 liters of water a day, and more if she is exercising. Patient admits to not drinking 2 L a day. Patient also with some urinary symptoms. Will treat as if this is a UTI with Macrobid. Patient has a primary care physician of her choice.   Luiz Blare, MD 08/16/11 2138

## 2011-08-16 NOTE — ED Notes (Signed)
Pt having a strong odor to her urine for about a month. She states its not necessarily malodorous, but just concentrated. She has occasional burning and frequency, but not always. She has no discharge and no new sexual partners or unprotected sex.

## 2012-03-09 ENCOUNTER — Emergency Department (INDEPENDENT_AMBULATORY_CARE_PROVIDER_SITE_OTHER)
Admission: EM | Admit: 2012-03-09 | Discharge: 2012-03-09 | Disposition: A | Discharge status: Home / Self Care | Payer: Medicaid Other | Source: Home / Self Care | Attending: Emergency Medicine | Admitting: Emergency Medicine

## 2012-03-09 ENCOUNTER — Encounter (HOSPITAL_COMMUNITY): Payer: Self-pay | Admitting: *Deleted

## 2012-03-09 DIAGNOSIS — A499 Bacterial infection, unspecified: Secondary | ICD-10-CM

## 2012-03-09 DIAGNOSIS — N760 Acute vaginitis: Secondary | ICD-10-CM

## 2012-03-09 DIAGNOSIS — B9689 Other specified bacterial agents as the cause of diseases classified elsewhere: Secondary | ICD-10-CM

## 2012-03-09 DIAGNOSIS — N3000 Acute cystitis without hematuria: Secondary | ICD-10-CM

## 2012-03-09 HISTORY — DX: Sexually transmitted chlamydial infection of other sites: A56.8

## 2012-03-09 HISTORY — DX: Other infections with a predominantly sexual mode of transmission complicating pregnancy, unspecified trimester: O98.319

## 2012-03-09 LAB — POCT URINALYSIS DIP (DEVICE)
Bilirubin Urine: NEGATIVE
Glucose, UA: NEGATIVE mg/dL
Hgb urine dipstick: NEGATIVE
Specific Gravity, Urine: 1.025 (ref 1.005–1.030)

## 2012-03-09 LAB — WET PREP, GENITAL

## 2012-03-09 MED ORDER — CEPHALEXIN 500 MG PO CAPS: 500.0000 mg | ORAL_CAPSULE | Freq: Three times a day (TID) | ORAL | Status: DC

## 2012-03-09 MED ORDER — METRONIDAZOLE 500 MG PO TABS: 500.0000 mg | ORAL_TABLET | Freq: Two times a day (BID) | ORAL | Status: DC

## 2012-03-09 NOTE — ED Notes (Signed)
Reports intermittent suprapubic discomfort, dysuria, urinary urgency,frequent urination, malodorous urine, and malodorous vaginal discharge x 1 wk.  Denies hx HTN; states "I've been pounding energy drinks".

## 2012-03-09 NOTE — ED Provider Notes (Signed)
Chief Complaint  Patient presents with  . Urinary Tract Infection  . Vaginal Discharge    History of Present Illness:   The patient is a 36 year old female who has had a one-week history of a malodorous vaginal discharge which is not itchy or irritating. She denies any vaginal or vulvar pain. No pelvic or lower back pain. She does have a history of bacterial vaginosis in the past and also Chlamydia and Trichomonas. Also for the past week her urine has smelled strong. She's had slight dysuria, frequency, and urgency, but no hematuria. She had a urinary tract infection years ago. She denies fever, chills, nausea, or vomiting. Her menses have been regular. Her last menstrual period has been within the past month. She is not sexually active and is not pregnant or breast-feeding.  Review of Systems:  Other than noted above, the patient denies any of the following symptoms: Systemic:  No fever, chills, sweats, fatigue, or weight loss. GI:  No abdominal pain, nausea, anorexia, vomiting, diarrhea, constipation, melena or hematochezia. GU:  No dysuria, frequency, urgency, hematuria, vaginal discharge, itching, or abnormal vaginal bleeding. Skin:  No rash or itching.  PMFSH:  Past medical history, family history, social history, meds, and allergies were reviewed.  Physical Exam:   Vital signs:  BP 146/100  Pulse 101  Temp 99.1 F (37.3 C) (Oral)  Resp 18  SpO2 98%  LMP 02/09/2012 General:  Alert, oriented and in no distress. Lungs:  Breath sounds clear and equal bilaterally.  No wheezes, rales or rhonchi. Heart:  Regular rhythm.  No gallops or murmers. Abdomen:  Soft, flat and non-distended.  No organomegaly or mass.  No tenderness, guarding or rebound.  Bowel sounds normally active. Pelvic exam:  Normal external genitalia, normal vaginal and cervical mucosa, she has a small amount of homogeneous white discharge which was non-malodorous. No cervical motion tenderness, no enlargement or tenderness  of uterus, no adnexal tenderness or mass. Skin:  Clear, warm and dry.  Labs:   Results for orders placed during the hospital encounter of 03/09/12  POCT URINALYSIS DIP (DEVICE)      Component Value Range   Glucose, UA NEGATIVE  NEGATIVE mg/dL   Bilirubin Urine NEGATIVE  NEGATIVE   Ketones, ur NEGATIVE  NEGATIVE mg/dL   Specific Gravity, Urine 1.025  1.005 - 1.030   Hgb urine dipstick NEGATIVE  NEGATIVE   pH 6.5  5.0 - 8.0   Protein, ur NEGATIVE  NEGATIVE mg/dL   Urobilinogen, UA 1.0  0.0 - 1.0 mg/dL   Nitrite POSITIVE (*) NEGATIVE   Leukocytes, UA TRACE (*) NEGATIVE  POCT PREGNANCY, URINE      Component Value Range   Preg Test, Ur NEGATIVE  NEGATIVE    Other Labs Obtained at Urgent Care Center:  A urine culture was obtained as well as wet prep, and DNA probe for gonorrhea and Chlamydia.  Results are pending at this time and we will call about any positive results.  Assessment:  The primary encounter diagnosis was Bacterial vaginosis. A diagnosis of Acute cystitis was also pertinent to this visit.  Plan:   1.  The following meds were prescribed:   New Prescriptions   CEPHALEXIN (KEFLEX) 500 MG CAPSULE    Take 1 capsule (500 mg total) by mouth 3 (three) times daily.   METRONIDAZOLE (FLAGYL) 500 MG TABLET    Take 1 tablet (500 mg total) by mouth 2 (two) times daily.   2.  The patient was instructed in symptomatic care  and handouts were given. 3.  The patient was told to return if becoming worse in any way, if no better in 3 or 4 days, and given some red flag symptoms that would indicate earlier return.    Reuben Likes, MD 03/09/12 [phone]

## 2012-03-12 LAB — GC/CHLAMYDIA PROBE AMP
CT Probe RNA: NEGATIVE
GC Probe RNA: NEGATIVE

## 2012-03-12 LAB — URINE CULTURE

## 2012-03-12 NOTE — ED Notes (Addendum)
As of 03-12-2012 :  GC report negative Chlamydia negative No trichomania No yeast Many clue Few WBC in wet prep BV adequately treated w flagyl,  UA positive for E coli, and adequate treatment w Keflex

## 2012-05-11 ENCOUNTER — Emergency Department (HOSPITAL_COMMUNITY)
Admission: EM | Admit: 2012-05-11 | Discharge: 2012-05-11 | Disposition: A | Discharge status: Home / Self Care | Payer: Medicaid Other | Source: Home / Self Care | Attending: Emergency Medicine | Admitting: Emergency Medicine

## 2012-05-11 ENCOUNTER — Other Ambulatory Visit (HOSPITAL_COMMUNITY)
Admission: RE | Admit: 2012-05-11 | Discharge: 2012-05-11 | Disposition: A | Discharge status: Home / Self Care | Payer: Medicaid Other | Source: Ambulatory Visit | Attending: Emergency Medicine | Admitting: Emergency Medicine

## 2012-05-11 ENCOUNTER — Encounter (HOSPITAL_COMMUNITY): Payer: Self-pay | Admitting: Emergency Medicine

## 2012-05-11 DIAGNOSIS — N760 Acute vaginitis: Secondary | ICD-10-CM | POA: Insufficient documentation

## 2012-05-11 DIAGNOSIS — B9689 Other specified bacterial agents as the cause of diseases classified elsewhere: Secondary | ICD-10-CM

## 2012-05-11 DIAGNOSIS — Z113 Encounter for screening for infections with a predominantly sexual mode of transmission: Secondary | ICD-10-CM | POA: Insufficient documentation

## 2012-05-11 DIAGNOSIS — A499 Bacterial infection, unspecified: Secondary | ICD-10-CM

## 2012-05-11 HISTORY — DX: Nontoxic goiter, unspecified: E04.9

## 2012-05-11 LAB — POCT URINALYSIS DIP (DEVICE)
Bilirubin Urine: NEGATIVE
Glucose, UA: NEGATIVE mg/dL
Hgb urine dipstick: NEGATIVE
Ketones, ur: NEGATIVE mg/dL
Leukocytes, UA: NEGATIVE
Nitrite: NEGATIVE
pH: 8.5 — ABNORMAL HIGH (ref 5.0–8.0)

## 2012-05-11 MED ORDER — METRONIDAZOLE 500 MG PO TABS: 500.0000 mg | ORAL_TABLET | Freq: Two times a day (BID) | ORAL | Status: DC

## 2012-05-11 NOTE — ED Provider Notes (Signed)
Chief Complaint  Patient presents with  . Vaginal Discharge    History of Present Illness:   The patient is a 37 year old female who has had a one-week history of a malodorous vaginal discharge and a slight itch. She denies any vulvar vaginal pain or vulvar lesions. She's had no pelvic pain or lower back pain. She denies any urinary symptoms, fever, chills, nausea, or vomiting. She does have a history of bacterial vaginosis and Trichomonas in the past as well as Chlamydia infection.  Review of Systems:  Other than noted above, the patient denies any of the following symptoms: Systemic:  No fever, chills, sweats, fatigue, or weight loss. GI:  No abdominal pain, nausea, anorexia, vomiting, diarrhea, constipation, melena or hematochezia. GU:  No dysuria, frequency, urgency, hematuria, vaginal discharge, itching, or abnormal vaginal bleeding. Skin:  No rash or itching.  PMFSH:  Past medical history, family history, social history, meds, and allergies were reviewed.  Physical Exam:   Vital signs:  BP 150/98  Pulse 91  Temp 97.9 F (36.6 C) (Oral)  Resp 18  SpO2 99%  LMP 04/09/2012 General:  Alert, oriented and in no distress. Lungs:  Breath sounds clear and equal bilaterally.  No wheezes, rales or rhonchi. Heart:  Regular rhythm.  No gallops or murmers. Abdomen:  Soft, flat and non-distended.  No organomegaly or mass.  No tenderness, guarding or rebound.  Bowel sounds normally active. Pelvic exam:  Normal external genitalia, vaginal and cervical mucosa were normal. There was a small amount of homogeneous, white discharge. No cervical motion pain, uterus was normal in size and nontender, no adnexal masses or tenderness. Skin:  Clear, warm and dry.  Labs:   Results for orders placed during the hospital encounter of 05/11/12  POCT URINALYSIS DIP (DEVICE)      Component Value Range   Glucose, UA NEGATIVE  NEGATIVE mg/dL   Bilirubin Urine NEGATIVE  NEGATIVE   Ketones, ur NEGATIVE  NEGATIVE  mg/dL   Specific Gravity, Urine 1.020  1.005 - 1.030   Hgb urine dipstick NEGATIVE  NEGATIVE   pH 8.5 (*) 5.0 - 8.0   Protein, ur NEGATIVE  NEGATIVE mg/dL   Urobilinogen, UA 0.2  0.0 - 1.0 mg/dL   Nitrite NEGATIVE  NEGATIVE   Leukocytes, UA NEGATIVE  NEGATIVE  POCT PREGNANCY, URINE      Component Value Range   Preg Test, Ur NEGATIVE  NEGATIVE    Other Labs Obtained at [facility]:  DNA probe for gonorrhea, Chlamydia, Trichomonas, Gardnerella, Candida were obtained.  Results are pending at this time and we will call about any positive results.  Assessment:  The encounter diagnosis was Bacterial vaginosis.  Plan:   1.  The following meds were prescribed:   New Prescriptions   METRONIDAZOLE (FLAGYL) 500 MG TABLET    Take 1 tablet (500 mg total) by mouth 2 (two) times daily.   2.  The patient was instructed in symptomatic care and handouts were given. 3.  The patient was told to return if becoming worse in any way, if no better in 3 or 4 days, and given some red flag symptoms that would indicate earlier return.    Reuben Likes, MD 05/11/12 [phone]

## 2012-05-11 NOTE — ED Notes (Signed)
Pt c/o vag discharge x1 week Sx include: milky white discharge w/a "fishy" odor and slight itching Denies: urinary prob, spotting, abd pain  She is alert w/no signs of acute distress.

## 2012-05-15 ENCOUNTER — Other Ambulatory Visit: Payer: Self-pay | Admitting: Internal Medicine

## 2012-05-15 DIAGNOSIS — E049 Nontoxic goiter, unspecified: Secondary | ICD-10-CM

## 2012-05-17 ENCOUNTER — Telehealth (HOSPITAL_COMMUNITY): Payer: Self-pay | Admitting: *Deleted

## 2012-05-17 NOTE — ED Notes (Signed)
Pt. called back.  Pt. verified x 2 and given results.  Pt. told she was adequately treated for both bacterial vaginosis and Trich with the Flagyl.  You need to notify your partner to be treated with Flagyl, no sex until you have finished your medication and your partner has been treated and to practice safe sex. You can get HIV testing at the [facility] clinic.  Pt. voiced understanding. Vassie Moselle 05/17/2012

## 2012-05-17 NOTE — ED Notes (Signed)
GC/chlamydia neg., Affirm: Candida neg., Gardnerella and Trich pos.  Pt. adequately treated with Flagyl. I called and left pt. a message to call. Vassie Moselle 05/17/2012

## 2012-05-22 ENCOUNTER — Ambulatory Visit
Admission: RE | Admit: 2012-05-22 | Discharge: 2012-05-22 | Disposition: A | Discharge status: Home / Self Care | Payer: Medicaid Other | Source: Ambulatory Visit | Attending: Internal Medicine | Admitting: Internal Medicine

## 2012-05-22 ENCOUNTER — Ambulatory Visit (INDEPENDENT_AMBULATORY_CARE_PROVIDER_SITE_OTHER): Payer: Medicaid Other | Admitting: General Surgery

## 2012-05-22 ENCOUNTER — Encounter (INDEPENDENT_AMBULATORY_CARE_PROVIDER_SITE_OTHER): Payer: Self-pay | Admitting: General Surgery

## 2012-05-22 VITALS — BP 120/74 | HR 80 | Temp 96.7°F | Resp 18 | Ht 64.0 in | Wt 188.8 lb

## 2012-05-22 DIAGNOSIS — E049 Nontoxic goiter, unspecified: Secondary | ICD-10-CM

## 2012-05-22 NOTE — Progress Notes (Signed)
Subjective:     Patient ID: Kaitlyn Rasmussen, female   DOB: 09/29/1975, 37 y.o.   MRN: [id number]  HPI We're asked to see the patient in consultation by Dr. Roderic Palau to evaluate her for a thyroid nodule. The patient is a 37 year old white female who has had an enlarged thyroid gland for at least 6 or 7 years. She had an ultrasound back in 2007 that showed a very enlarged thyroid gland with multiple nodules on both sides. She has been treated off and on with Synthroid. She is scheduled for a followup ultrasound today. She denies any hoarseness. She does get shortness of breath and has difficulty swallowing food and pills.  Review of Systems  Constitutional: Negative.   HENT: Negative.   Eyes: Negative.   Respiratory: Positive for choking.   Cardiovascular: Negative.   Gastrointestinal: Negative.   Genitourinary: Negative.   Musculoskeletal: Negative.   Skin: Negative.   Neurological: Negative.   Hematological: Negative.   Psychiatric/Behavioral: Negative.        Objective:   Physical Exam  Constitutional: She is oriented to person, place, and time. She appears well-developed and well-nourished.  HENT:  Head: Normocephalic and atraumatic.  Eyes: Conjunctivae normal and EOM are normal. Pupils are equal, round, and reactive to light.  Neck: Normal range of motion. Neck supple.       She has a very palpably enlarged thyroid gland bilaterally with multiple nodules. The enlargement seems to extend back behind the sternum.  Cardiovascular: Normal rate, regular rhythm and normal heart sounds.   Pulmonary/Chest: Effort normal and breath sounds normal.  Abdominal: Soft. Bowel sounds are normal.  Musculoskeletal: Normal range of motion.  Neurological: She is alert and oriented to person, place, and time.  Skin: Skin is warm and dry.  Psychiatric: She has a normal mood and affect. Her behavior is normal.       Assessment:     The patient has a very large thyroid gland with multiple  bilateral nodules. She is scheduled for an ultrasound later today. She has significant compressive symptoms. Because of this I do not think she will need a biopsy and because I think both lobes of the thyroid gland are going to need to be removed is. She may require an MRI and if the thyroid extends substernally she may require involvement of the thoracic surgeons for the operation. I've discussed with her in detail the risks and benefits of the operation to remove the thyroid gland as well as some of the technical aspects including the risk of injury to the parathyroid glands and recurrent laryngeal nerve and she understands and wishes to proceed    Plan:     We will start with an ultrasound of her neck today and possibly proceed with MRI as well. We will follow her up in about 2 weeks to go over the results of the studies

## 2012-06-04 ENCOUNTER — Encounter (INDEPENDENT_AMBULATORY_CARE_PROVIDER_SITE_OTHER): Payer: Medicaid Other | Admitting: General Surgery

## 2012-06-05 ENCOUNTER — Encounter (INDEPENDENT_AMBULATORY_CARE_PROVIDER_SITE_OTHER): Payer: Self-pay | Admitting: General Surgery

## 2012-06-05 ENCOUNTER — Ambulatory Visit (INDEPENDENT_AMBULATORY_CARE_PROVIDER_SITE_OTHER): Payer: Medicaid Other | Admitting: General Surgery

## 2012-06-05 VITALS — HR 95 | Temp 96.5°F | Resp 18 | Ht 64.0 in | Wt 188.4 lb

## 2012-06-05 DIAGNOSIS — E049 Nontoxic goiter, unspecified: Secondary | ICD-10-CM

## 2012-06-05 NOTE — Patient Instructions (Signed)
Continue thyroid suppression

## 2012-06-06 NOTE — Progress Notes (Signed)
Subjective:     Patient ID: Kaitlyn Rasmussen, female   DOB: 1975-07-20, 37 y.o.   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 Since her last visit she has been started on suppression thyroid hormone therapy. She feels as though the size of the thyroid gland may have decreased. She denies now any problems swallowing solid foods. She denies any shortness of breath.  Review of Systems  Constitutional: Negative.   HENT: Negative.   Eyes: Negative.   Respiratory: Negative.   Cardiovascular: Negative.   Gastrointestinal: Negative.   Genitourinary: Negative.   Musculoskeletal: Negative.   Skin: Negative.   Neurological: Negative.   Hematological: Negative.   Psychiatric/Behavioral: Negative.        Objective:   Physical Exam  Constitutional: She is oriented to person, place, and time. She appears well-developed and well-nourished.  HENT:  Head: Normocephalic and atraumatic.  Eyes: Conjunctivae normal and EOM are normal. Pupils are equal, round, and reactive to light.  Neck: Normal range of motion. Neck supple.       There is a large palpable multinodular goiter bilaterally involving most of the central neck. There is no hoarseness.  Cardiovascular: Normal rate, regular rhythm and normal heart sounds.   Pulmonary/Chest: Effort normal and breath sounds normal.  Abdominal: Soft. Bowel sounds are normal. She exhibits no mass. There is no tenderness.  Musculoskeletal: Normal range of motion.  Neurological: She is alert and oriented to person, place, and time.  Skin: Skin is warm and dry.  Psychiatric: She has a normal mood and affect. Her behavior is normal.       Assessment:     The patient has an extremely large multinodular thyroid gland. She has had some compressive symptoms in the past but these seem to be better on the suppression thyroid hormone therapy. She would like to wait  to see if the thyroid gland will shrink anymore prior to surgical intervention    Plan:     She will continue to take thyroid hormone every day and we will plan to see her back in 3 months and reexamine her neck.

## 2012-07-26 ENCOUNTER — Encounter (INDEPENDENT_AMBULATORY_CARE_PROVIDER_SITE_OTHER): Payer: Self-pay | Admitting: General Surgery

## 2012-10-02 ENCOUNTER — Encounter (INDEPENDENT_AMBULATORY_CARE_PROVIDER_SITE_OTHER): Payer: Self-pay | Admitting: General Surgery

## 2012-10-02 ENCOUNTER — Ambulatory Visit (INDEPENDENT_AMBULATORY_CARE_PROVIDER_SITE_OTHER): Payer: Medicaid Other | Admitting: General Surgery

## 2012-10-02 VITALS — BP 122/80 | HR 87 | Temp 98.2°F | Resp 16 | Ht 64.0 in | Wt 174.8 lb

## 2012-10-02 DIAGNOSIS — E049 Nontoxic goiter, unspecified: Secondary | ICD-10-CM

## 2012-10-02 NOTE — Patient Instructions (Signed)
Will get follow up u/s of neck

## 2012-10-02 NOTE — Progress Notes (Signed)
Subjective:     Patient ID: Kaitlyn Rasmussen, female   DOB: 05/10/1975, 37 y.o.   MRN: [id number]  HPI The patient is a 37 year old black female who has had a large multinodular goiter for quite some time. She has started on suppression thyroid hormone therapy and she feels as though the thyroid goiter has continued to get smaller. She denies any compressive symptoms anymore. Overall she feels well  Review of Systems  Constitutional: Negative.   HENT: Negative.   Eyes: Negative.   Respiratory: Negative.   Cardiovascular: Negative.   Gastrointestinal: Negative.   Endocrine: Negative.   Genitourinary: Negative.   Musculoskeletal: Negative.   Skin: Negative.   Allergic/Immunologic: Negative.   Neurological: Negative.   Hematological: Negative.   Psychiatric/Behavioral: Negative.        Objective:   Physical Exam  Constitutional: She is oriented to person, place, and time. She appears well-developed and well-nourished.  HENT:  Head: Normocephalic and atraumatic.  Eyes: Conjunctivae and EOM are normal. Pupils are equal, round, and reactive to light.  Neck: Normal range of motion. Neck supple.  She has enlargement of the thyroid gland bilaterally. There is no hoarseness. There is no difficulty swallowing solid foods.  Cardiovascular: Normal rate, regular rhythm and normal heart sounds.   Pulmonary/Chest: Effort normal and breath sounds normal.  Abdominal: Soft. Bowel sounds are normal.  Musculoskeletal: Normal range of motion.  Neurological: She is alert and oriented to person, place, and time.  Skin: Skin is warm and dry.  Psychiatric: She has a normal mood and affect. Her behavior is normal.       Assessment:     The patient has a large multinodular goiter. At this point we will repeat her ultrasound to see if any of the nodules are growing. If there is enlargement and I would recommend having her thyroid gland removed.     Plan:     Plan to repeat her ultrasound of her  neck. If the nodules are the same or smaller then we will plan to see her back in 6 months

## 2012-10-08 ENCOUNTER — Other Ambulatory Visit: Payer: Self-pay

## 2012-10-08 DIAGNOSIS — Z1231 Encounter for screening mammogram for malignant neoplasm of breast: Secondary | ICD-10-CM

## 2012-10-24 ENCOUNTER — Emergency Department (HOSPITAL_COMMUNITY)
Admission: EM | Admit: 2012-10-24 | Discharge: 2012-10-24 | Disposition: A | Discharge status: Home / Self Care | Payer: Medicaid Other | Source: Home / Self Care | Attending: Family Medicine | Admitting: Family Medicine

## 2012-10-24 ENCOUNTER — Encounter (HOSPITAL_COMMUNITY): Payer: Self-pay | Admitting: Emergency Medicine

## 2012-10-24 ENCOUNTER — Other Ambulatory Visit (HOSPITAL_COMMUNITY)
Admission: RE | Admit: 2012-10-24 | Discharge: 2012-10-24 | Disposition: A | Discharge status: Home / Self Care | Payer: Medicaid Other | Source: Ambulatory Visit | Attending: Family Medicine | Admitting: Family Medicine

## 2012-10-24 DIAGNOSIS — N760 Acute vaginitis: Secondary | ICD-10-CM | POA: Insufficient documentation

## 2012-10-24 DIAGNOSIS — Z113 Encounter for screening for infections with a predominantly sexual mode of transmission: Secondary | ICD-10-CM | POA: Insufficient documentation

## 2012-10-24 LAB — POCT URINALYSIS DIP (DEVICE)
Bilirubin Urine: NEGATIVE
Glucose, UA: NEGATIVE mg/dL
Ketones, ur: NEGATIVE mg/dL
Leukocytes, UA: NEGATIVE
Nitrite: NEGATIVE
pH: 6 (ref 5.0–8.0)

## 2012-10-24 MED ORDER — FLUCONAZOLE 150 MG PO TABS: ORAL_TABLET | ORAL | Status: DC

## 2012-10-24 MED ORDER — METRONIDAZOLE 500 MG PO TABS: 500.0000 mg | ORAL_TABLET | Freq: Two times a day (BID) | ORAL | Status: DC

## 2012-10-24 NOTE — ED Provider Notes (Signed)
History    CSN: [id number] Arrival date & time 10/24/12  1130  First MD Initiated Contact with Patient 10/24/12 1305     Chief Complaint  Patient presents with  . Vaginal Discharge   (Consider location/radiation/quality/duration/timing/severity/associated sxs/prior Treatment) HPI Comments: And 37 year old female with history of trichomonas and Chlamydia infection in the past. Here complaining of vaginal discharge with a fishy smell for 3 weeks. Symptoms getting worse. Denies pelvic or abdominal pain also denies fever or dysuria.  Past Medical History  Diagnosis Date  . BV (bacterial vaginosis)   . Trichomonal cervicitis   . UTI (lower urinary tract infection)   . Chlamydia trachomatis infection in pregnancy   . Goiter    Past Surgical History  Procedure Laterality Date  . Tubal ligation  02/02/2001   Family History  Problem Relation Age of Onset  . Cancer Mother     Eustaquio Boyden  . Cancer Sister     Sister  . Thyroid disease Maternal Grandmother    History  Substance Use Topics  . Smoking status: Current Some Day Smoker -- 0.25 packs/day    Types: Cigarettes  . Smokeless tobacco: Never Used  . Alcohol Use: Yes     Comment: occasional/social   OB History   Grav Para Term Preterm Abortions TAB SAB Ect Mult Living                 Review of Systems  Constitutional: Negative for fever, chills and appetite change.  Gastrointestinal: Negative for nausea, vomiting and abdominal pain.  Genitourinary: Positive for vaginal discharge. Negative for dysuria, hematuria, flank pain and pelvic pain.  Skin: Negative for rash.  Neurological: Negative for dizziness and headaches.    Allergies  Review of patient's allergies indicates no known allergies.  Home Medications   Current Outpatient Rx  Name  Route  Sig  Dispense  Refill  . levothyroxine (SYNTHROID, LEVOTHROID) 100 MCG tablet   Oral   Take 100 mcg by mouth daily.         . phentermine (ADIPEX-P) 37.5 MG  tablet   Oral   Take 37.5 mg by mouth daily before breakfast.         . fluconazole (DIFLUCAN) 150 MG tablet      1 tab po q72 h x2   2 tablet   0   . metroNIDAZOLE (FLAGYL) 500 MG tablet   Oral   Take 1 tablet (500 mg total) by mouth 2 (two) times daily.   28 tablet   0    BP 148/93  Pulse 90  Temp(Src) 98 F (36.7 C) (Oral)  Resp 12  SpO2 100%  LMP 10/06/2012 Physical Exam  Nursing note and vitals reviewed. Constitutional: She is oriented to person, place, and time. She appears well-developed and well-nourished. No distress.  Cardiovascular: Normal heart sounds.   Pulmonary/Chest: Breath sounds normal.  Abdominal: Soft. She exhibits no distension. There is no tenderness.  Genitourinary: Uterus normal. Cervix exhibits no motion tenderness and no friability. Right adnexum displays no mass, no tenderness and no fullness. Left adnexum displays no mass, no tenderness and no fullness. There is erythema around the vagina. Vaginal discharge found.  Lymphadenopathy:       Right: No inguinal adenopathy present.       Left: No inguinal adenopathy present.  Neurological: She is alert and oriented to person, place, and time.  Skin: No rash noted. She is not diaphoretic.    ED Course  Procedures (including critical care time)  Labs Reviewed  POCT URINALYSIS DIP (DEVICE)  POCT PREGNANCY, URINE  CERVICOVAGINAL ANCILLARY ONLY   No results found. 1. Vaginitis     MDM  Treated empirically with Flagyl and Diflucan. GC and Chlamydia and a firm test pending at the time of discharge. Supportive care and red flags should prompt her return to medical attention discussed with patient and provided in writing.  Sharin Grave, MD 10/26/12 [phone]

## 2012-10-24 NOTE — ED Notes (Signed)
C/o vaginal discharge x 3 weeks.Marland Kitchen Discharge is white in color with a musty/fishy smell...says urine smells strong but no signs of blood...denies any abdominal pain.Marland Kitchendenies any fever..has had some previous back pain .Marland KitchenMarland KitchenTJK student

## 2012-10-26 NOTE — ED Notes (Signed)
GC/Chlamydia neg., Affirm: Candida and Trich neg., Gardnerella pos.  Pt. adequately treated with Flagyl. Vassie Moselle 10/26/2012

## 2012-11-08 ENCOUNTER — Ambulatory Visit
Admission: RE | Admit: 2012-11-08 | Discharge: 2012-11-08 | Disposition: A | Discharge status: Home / Self Care | Payer: Medicaid Other | Source: Ambulatory Visit

## 2012-11-08 DIAGNOSIS — Z1231 Encounter for screening mammogram for malignant neoplasm of breast: Secondary | ICD-10-CM

## 2012-12-21 ENCOUNTER — Telehealth (INDEPENDENT_AMBULATORY_CARE_PROVIDER_SITE_OTHER): Payer: Self-pay | Admitting: General Surgery

## 2012-12-21 NOTE — Telephone Encounter (Signed)
LMOM letting pt know that we had scheduled her f/u US head/neck for 12/28/12 at 1:00 at [facility] located at [address].

## 2012-12-26 NOTE — Telephone Encounter (Signed)
Per Kaitlyn Rasmussen and [facility], pt insurance lapsed and she does not have money to pay out of pocket.

## 2012-12-28 ENCOUNTER — Other Ambulatory Visit: Payer: Medicaid Other

## 2013-02-15 ENCOUNTER — Encounter (INDEPENDENT_AMBULATORY_CARE_PROVIDER_SITE_OTHER): Payer: Self-pay | Admitting: General Surgery

## 2013-06-24 ENCOUNTER — Ambulatory Visit (INDEPENDENT_AMBULATORY_CARE_PROVIDER_SITE_OTHER): Payer: 59 | Admitting: Physician Assistant

## 2013-06-24 VITALS — BP 147/95 | HR 93 | Temp 98.2°F | Resp 18 | Ht 64.0 in | Wt 176.4 lb

## 2013-06-24 DIAGNOSIS — J069 Acute upper respiratory infection, unspecified: Secondary | ICD-10-CM

## 2013-06-24 DIAGNOSIS — J029 Acute pharyngitis, unspecified: Secondary | ICD-10-CM

## 2013-06-24 LAB — POCT RAPID STREP A (OFFICE): Rapid Strep A Screen: NEGATIVE

## 2013-06-24 MED ORDER — FIRST-DUKES MOUTHWASH MT SUSP: 10.0000 mL | OROMUCOSAL | Status: DC | PRN

## 2013-06-24 MED ORDER — IPRATROPIUM BROMIDE 0.03 % NA SOLN: 2.0000 | Freq: Two times a day (BID) | NASAL | Status: DC

## 2013-06-24 NOTE — Progress Notes (Signed)
   Subjective:    Patient ID: Kaitlyn Rasmussen, female    DOB: August 06, 1975, 38 y.o.   MRN: [id number]  HPI   Kaitlyn Rasmussen is a very pleasant 38 yr old female here with concern for illness.  Reports symptoms began 5 days ago.  Symptoms include sneezing, runny nose, cough, HA, sore throat.  Throat "feels like razor blades", esp at night.  Starting to lose her voice.  No fever.  No SOB or wheezing.  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  Has used Tylenol, Theraflu, EmergenC for symptoms.     Review of Systems  Constitutional: Negative for fever and chills.  HENT: Positive for congestion, rhinorrhea and sore throat. Negative for ear pain.   Respiratory: Positive for cough. Negative for shortness of breath and wheezing.   Cardiovascular: Negative.   Gastrointestinal: Negative.   Musculoskeletal: Negative.   Skin: Negative.   Neurological: Positive for headaches.       Objective:   Physical Exam  Vitals reviewed. Constitutional: She is oriented to person, place, and time. She appears well-developed and well-nourished. No distress.  HENT:  Head: Normocephalic and atraumatic.  Right Ear: Tympanic membrane and ear canal normal.  Left Ear: Tympanic membrane and ear canal normal.  Nose: Mucosal edema and rhinorrhea present. Right sinus exhibits no maxillary sinus tenderness and no frontal sinus tenderness. Left sinus exhibits no maxillary sinus tenderness and no frontal sinus tenderness.  Mouth/Throat: Uvula is midline, oropharynx is clear and moist and mucous membranes are normal.  Eyes: Conjunctivae are normal. No scleral icterus.  Neck: Neck supple. Thyromegaly present.  Cardiovascular: Normal rate, regular rhythm and normal heart sounds.   Pulmonary/Chest: Effort normal. She has no wheezes. She has no rales.  Abdominal: Soft. There is no tenderness.  Lymphadenopathy:    She has no cervical adenopathy.  Neurological: She is alert and oriented to person, place, and time.  Skin:  Skin is warm and dry.  Psychiatric: She has a normal mood and affect. Her behavior is normal.    Results for orders placed in visit on 06/24/13  POCT RAPID STREP A (OFFICE)      Result Value Ref Range   Rapid Strep A Screen Negative  Negative       Assessment & Plan:  1. Acute pharyngitis Kaitlyn Rasmussen is a pleasant 38 yr old female with 5 days of sore throat associated with URI symptoms.  Suspect viral etiology.  Rapid strep is negative.  Cx pending.  Will treat symptomatically.  Note provided for work tomorrow.  - POCT rapid strep A - Culture, Group A Strep - Diphenhyd-Hydrocort-Nystatin (FIRST-DUKES MOUTHWASH) SUSP; Take 10 mLs by mouth every 2 (two) hours as needed. With 1:1 ratio viscous lidocaine  Dispense: 360 mL; Refill: 0  2. Viral URI - ipratropium (ATROVENT) 0.03 % nasal spray; Place 2 sprays into the nose 2 (two) times daily.  Dispense: 30 mL; Refill: 1   Pt to call or RTC if worsening or not improving  E. Natividad Brood MHS, PA-C [facility] 2/24/20151:01 PM

## 2013-06-24 NOTE — Patient Instructions (Signed)
I suspect that a cold virus is causing your symptoms.  Your strep test is negative today.  I am sending a culture to confirm this and will let you know when this is back  Use the Atrovent nasal spray 2-3 times per day to help with runny nose and congestion  Use the Magic Mouthwash every 2 hours if needed for throat pain  Use ibuprofen 600mg  every hours as needed for sore throat  Plenty of fluids (water is best!) and rest  Please let us know if any symptoms are worsening or not improving   Upper Respiratory Infection, Adult An upper respiratory infection (URI) is also sometimes known as the common cold. The upper respiratory tract includes the nose, sinuses, throat, trachea, and bronchi. Bronchi are the airways leading to the lungs. Most people improve within 1 week, but symptoms can last up to 2 weeks. A residual cough may last even longer.  CAUSES Many different viruses can infect the tissues lining the upper respiratory tract. The tissues become irritated and inflamed and often become very moist. Mucus production is also common. A cold is contagious. You can easily spread the virus to others by oral contact. This includes kissing, sharing a glass, coughing, or sneezing. Touching your mouth or nose and then touching a surface, which is then touched by another person, can also spread the virus. SYMPTOMS  Symptoms typically develop 1 to 3 days after you come in contact with a cold virus. Symptoms vary from person to person. They may include:  Runny nose.  Sneezing.  Nasal congestion.  Sinus irritation.  Sore throat.  Loss of voice (laryngitis).  Cough.  Fatigue.  Muscle aches.  Loss of appetite.  Headache.  Low-grade fever. DIAGNOSIS  You might diagnose your own cold based on familiar symptoms, since most people get a cold 2 to 3 times a year. Your caregiver can confirm this based on your exam. Most importantly, your caregiver can check that your symptoms are not due to  another disease such as strep throat, sinusitis, pneumonia, asthma, or epiglottitis. Blood tests, throat tests, and X-rays are not necessary to diagnose a common cold, but they may sometimes be helpful in excluding other more serious diseases. Your caregiver will decide if any further tests are required. RISKS AND COMPLICATIONS  You may be at risk for a more severe case of the common cold if you smoke cigarettes, have chronic heart disease (such as heart failure) or lung disease (such as asthma), or if you have a weakened immune system. The very young and very old are also at risk for more serious infections. Bacterial sinusitis, middle ear infections, and bacterial pneumonia can complicate the common cold. The common cold can worsen asthma and chronic obstructive pulmonary disease (COPD). Sometimes, these complications can require emergency medical care and may be life-threatening. PREVENTION  The best way to protect against getting a cold is to practice good hygiene. Avoid oral or hand contact with people with cold symptoms. Wash your hands often if contact occurs. There is no clear evidence that vitamin C, vitamin E, echinacea, or exercise reduces the chance of developing a cold. However, it is always recommended to get plenty of rest and practice good nutrition. TREATMENT  Treatment is directed at relieving symptoms. There is no cure. Antibiotics are not effective, because the infection is caused by a virus, not by bacteria. Treatment may include:  Increased fluid intake. Sports drinks offer valuable electrolytes, sugars, and fluids.  Breathing heated mist or steam (  vaporizer or shower).  Eating chicken soup or other clear broths, and maintaining good nutrition.  Getting plenty of rest.  Using gargles or lozenges for comfort.  Controlling fevers with ibuprofen or acetaminophen as directed by your caregiver.  Increasing usage of your inhaler if you have asthma. Zinc gel and zinc lozenges,  taken in the first 24 hours of the common cold, can shorten the duration and lessen the severity of symptoms. Pain medicines may help with fever, muscle aches, and throat pain. A variety of non-prescription medicines are available to treat congestion and runny nose. Your caregiver can make recommendations and may suggest nasal or lung inhalers for other symptoms.  HOME CARE INSTRUCTIONS   Only take over-the-counter or prescription medicines for pain, discomfort, or fever as directed by your caregiver.  Use a warm mist humidifier or inhale steam from a shower to increase air moisture. This may keep secretions moist and make it easier to breathe.  Drink enough water and fluids to keep your urine clear or pale yellow.  Rest as needed.  Return to work when your temperature has returned to normal or as your caregiver advises. You may need to stay home longer to avoid infecting others. You can also use a face mask and careful hand washing to prevent spread of the virus. SEEK MEDICAL CARE IF:   After the first few days, you feel you are getting worse rather than better.  You need your caregiver's advice about medicines to control symptoms.  You develop chills, worsening shortness of breath, or brown or red sputum. These may be signs of pneumonia.  You develop yellow or brown nasal discharge or pain in the face, especially when you bend forward. These may be signs of sinusitis.  You develop a fever, swollen neck glands, pain with swallowing, or white areas in the back of your throat. These may be signs of strep throat. SEEK IMMEDIATE MEDICAL CARE IF:   You have a fever.  You develop severe or persistent headache, ear pain, sinus pain, or chest pain.  You develop wheezing, a prolonged cough, cough up blood, or have a change in your usual mucus (if you have chronic lung disease).  You develop sore muscles or a stiff neck. Document Released: 10/12/2000 Document Revised: 07/11/2011 Document  Reviewed: 08/20/2010 [facility] Patient Information 2014 [place], [place].

## 2013-06-27 LAB — CULTURE, GROUP A STREP: Organism ID, Bacteria: NORMAL

## 2013-09-10 ENCOUNTER — Ambulatory Visit (INDEPENDENT_AMBULATORY_CARE_PROVIDER_SITE_OTHER): Payer: 59 | Admitting: General Surgery

## 2013-09-17 ENCOUNTER — Encounter (INDEPENDENT_AMBULATORY_CARE_PROVIDER_SITE_OTHER): Payer: Self-pay | Admitting: General Surgery

## 2013-09-17 ENCOUNTER — Telehealth (INDEPENDENT_AMBULATORY_CARE_PROVIDER_SITE_OTHER): Payer: Self-pay | Admitting: General Surgery

## 2013-09-17 ENCOUNTER — Ambulatory Visit (INDEPENDENT_AMBULATORY_CARE_PROVIDER_SITE_OTHER): Payer: 59 | Admitting: General Surgery

## 2013-09-17 VITALS — BP 130/86 | HR 76 | Temp 97.8°F | Resp 12 | Ht 64.0 in | Wt 171.0 lb

## 2013-09-17 DIAGNOSIS — E049 Nontoxic goiter, unspecified: Secondary | ICD-10-CM

## 2013-09-17 NOTE — Progress Notes (Signed)
Patient ID: Kaitlyn Rasmussen, female   DOB: [birth year], 38 y.o.   MRN: [id number]  Chief Complaint  Patient presents with  . Follow-up    LTFU/thyroid rechk    HPI Kaitlyn Rasmussen is a 38 y.o. female.  The patient is a 38 -year-old black female who we have been following for some time with bilateral large thyroid nodules. She does at times have some compressive symptoms associated with them. They have been biopsied in the past and felt to be benign. She has been thinking about it a lot lately and is now ready to have her thyroid gland removed.  HPI  Past Medical History  Diagnosis Date  . BV (bacterial vaginosis)   . Trichomonal cervicitis   . UTI (lower urinary tract infection)   . Chlamydia trachomatis infection in pregnancy   . Goiter   . Allergy   . Depression     Past Surgical History  Procedure Laterality Date  . Tubal ligation  02/02/2001    Family History  Problem Relation Age of Onset  . Cancer Mother     Melvern Banker  . Cancer Sister     Sister  . Thyroid disease Maternal Grandmother     Social History History  Substance Use Topics  . Smoking status: Current Some Day Smoker -- 0.25 packs/day    Types: Cigarettes  . Smokeless tobacco: Never Used  . Alcohol Use: Yes     Comment: occasional/social    No Known Allergies  Current Outpatient Prescriptions  Medication Sig Dispense Refill  . levothyroxine (SYNTHROID, LEVOTHROID) 100 MCG tablet Take 100 mcg by mouth daily.      . phentermine (ADIPEX-P) 37.5 MG tablet Take 37.5 mg by mouth daily before breakfast.      . Diphenhyd-Hydrocort-Nystatin (FIRST-DUKES MOUTHWASH) SUSP Take 10 mLs by mouth every 2 (two) hours as needed. With 1:1 ratio viscous lidocaine  360 mL  0  . fluconazole (DIFLUCAN) 150 MG tablet 1 tab po q72 h x2  2 tablet  0  . metroNIDAZOLE (FLAGYL) 500 MG tablet Take 1 tablet (500 mg total) by mouth 2 (two) times daily.  28 tablet  0   No current facility-administered medications for this  visit.    Review of Systems Review of Systems  Constitutional: Negative.   HENT: Negative.   Eyes: Negative.   Respiratory: Positive for shortness of breath.   Cardiovascular: Negative.   Gastrointestinal: Negative.   Endocrine: Negative.   Genitourinary: Negative.   Musculoskeletal: Negative.   Skin: Negative.   Allergic/Immunologic: Negative.   Neurological: Negative.   Hematological: Negative.   Psychiatric/Behavioral: Negative.     Blood pressure 130/86, pulse 76, temperature 97.8 F (36.6 C), temperature source Temporal, resp. rate 12, height 5\' 4"  (1.626 m), weight 171 lb (77.565 kg).  Physical Exam Physical Exam  Constitutional: She is oriented to person, place, and time. She appears well-developed and well-nourished.  HENT:  Head: Normocephalic and atraumatic.  Eyes: Conjunctivae and EOM are normal. Pupils are equal, round, and reactive to light.  Neck: Normal range of motion. Neck supple.  She has a significantly enlarged thyroid gland bilaterally with multiple nodules. There is no palpable cervical adenopathy.  Cardiovascular: Normal rate, regular rhythm and normal heart sounds.   Pulmonary/Chest: Effort normal and breath sounds normal.  Abdominal: Soft. Bowel sounds are normal.  Musculoskeletal: Normal range of motion.  Lymphadenopathy:    She has no cervical adenopathy.  Neurological: She is alert and oriented to person, place, and  time.  Skin: Skin is warm and dry.  Psychiatric: She has a normal mood and affect. Her behavior is normal.    Data Reviewed As above  Assessment    The patient has large bilateral thyroid nodules causing some compressive symptoms. At this point I think it would be reasonable to remove her thyroid gland. I've discussed with her in detail the risks and benefits of the operation to do this as well as some of the technical aspects including the risk of injury to the parathyroid glands and recurrent laryngeal nerve as well as the need  for lifelong thyroid replacement and she understands and wishes to proceed     Plan    Plan for a total thyroidectomy        Kaitlyn Rasmussen 09/17/2013, 11:50 AM

## 2013-09-17 NOTE — Telephone Encounter (Signed)
pt made aware of [facility] Financial Commitment  will call back to schedule sx

## 2013-10-15 ENCOUNTER — Ambulatory Visit (INDEPENDENT_AMBULATORY_CARE_PROVIDER_SITE_OTHER): Payer: 59 | Admitting: General Surgery

## 2014-03-03 ENCOUNTER — Ambulatory Visit (INDEPENDENT_AMBULATORY_CARE_PROVIDER_SITE_OTHER): Payer: 59 | Admitting: Family Medicine

## 2014-03-03 VITALS — BP 144/88 | HR 88 | Temp 98.4°F | Resp 16 | Ht 64.0 in | Wt 183.0 lb

## 2014-03-03 DIAGNOSIS — B9689 Other specified bacterial agents as the cause of diseases classified elsewhere: Secondary | ICD-10-CM

## 2014-03-03 DIAGNOSIS — N3000 Acute cystitis without hematuria: Secondary | ICD-10-CM

## 2014-03-03 DIAGNOSIS — N760 Acute vaginitis: Secondary | ICD-10-CM

## 2014-03-03 DIAGNOSIS — A499 Bacterial infection, unspecified: Secondary | ICD-10-CM

## 2014-03-03 DIAGNOSIS — R8290 Unspecified abnormal findings in urine: Secondary | ICD-10-CM

## 2014-03-03 DIAGNOSIS — R350 Frequency of micturition: Secondary | ICD-10-CM

## 2014-03-03 LAB — POCT WET PREP WITH KOH
KOH Prep POC: NEGATIVE
RBC Wet Prep HPF POC: NEGATIVE
TRICHOMONAS UA: NEGATIVE
YEAST WET PREP PER HPF POC: NEGATIVE

## 2014-03-03 LAB — POCT URINALYSIS DIPSTICK
BILIRUBIN UA: NEGATIVE
Glucose, UA: NEGATIVE
Ketones, UA: NEGATIVE
NITRITE UA: POSITIVE
Protein, UA: NEGATIVE
RBC UA: NEGATIVE
SPEC GRAV UA: 1.025
Urobilinogen, UA: 0.2
pH, UA: 6

## 2014-03-03 LAB — POCT UA - MICROSCOPIC ONLY
Casts, Ur, LPF, POC: NEGATIVE
Crystals, Ur, HPF, POC: NEGATIVE
MUCUS UA: NEGATIVE
RBC, URINE, MICROSCOPIC: NEGATIVE
Yeast, UA: NEGATIVE

## 2014-03-03 LAB — POCT URINE PREGNANCY: Preg Test, Ur: NEGATIVE

## 2014-03-03 MED ORDER — CIPROFLOXACIN HCL 500 MG PO TABS: 500.0000 mg | ORAL_TABLET | Freq: Two times a day (BID) | ORAL | Status: DC

## 2014-03-03 MED ORDER — METRONIDAZOLE 500 MG PO TABS: 500.0000 mg | ORAL_TABLET | Freq: Two times a day (BID) | ORAL | Status: DC

## 2014-03-03 NOTE — Progress Notes (Addendum)
[facility] [address] [place] [address] [phone]  Date:  03/03/2014   Name:  Kaitlyn Rasmussen   DOB:  December 20, 1975   MRN:  [id number]  PCP:  Philis Fendt, MD    Chief Complaint: Headache; Abdominal Pain; Back Pain; and Urinary Frequency   History of Present Illness:  Kaitlyn Rasmussen is a 38 y.o. very pleasant female patient who presents with the following:  She has had UTI type sx for a couple of weeks. She has noted urgency, strong urine odor, cloudy urine and some pink on the TP after wiping.   She has also noted some back pain in the bilateral low back. And suprapubic tenderness No fever that she has noted. She did throw up a couple of days ago She would ike to be checked for BV also.   She is generally in good health except for her goiter.   LMP 10/10  Patient Active Problem List   Diagnosis Date Noted  . RHINITIS, ALLERGIC, DUE TO POLLEN 09/06/2006  . VAGINAL DISCHARGE 08/08/2006  . GOITER NOS 06/29/2006  . TOBACCO DEPENDENCE 06/29/2006  . HEARTBURN 06/29/2006    Past Medical History  Diagnosis Date  . BV (bacterial vaginosis)   . Trichomonal cervicitis   . UTI (lower urinary tract infection)   . Chlamydia trachomatis infection in pregnancy   . Goiter   . Allergy   . Depression     Past Surgical History  Procedure Laterality Date  . Tubal ligation  02/02/2001    History  Substance Use Topics  . Smoking status: Current Some Day Smoker -- 0.25 packs/day    Types: Cigarettes  . Smokeless tobacco: Never Used  . Alcohol Use: 0.0 oz/week    0 Not specified per week     Comment: occasional/social    Family History  Problem Relation Age of Onset  . Cancer Mother     Melvern Banker  . Cancer Sister     Sister  . Thyroid disease Maternal Grandmother     No Known Allergies  Medication list has been reviewed and updated.  Current Outpatient Prescriptions on File Prior to Visit  Medication Sig Dispense Refill  .  Diphenhyd-Hydrocort-Nystatin (FIRST-DUKES MOUTHWASH) SUSP Take 10 mLs by mouth every 2 (two) hours as needed. With 1:1 ratio viscous lidocaine 360 mL 0  . fluconazole (DIFLUCAN) 150 MG tablet 1 tab po q72 h x2 2 tablet 0  . levothyroxine (SYNTHROID, LEVOTHROID) 100 MCG tablet Take 100 mcg by mouth daily.    . metroNIDAZOLE (FLAGYL) 500 MG tablet Take 1 tablet (500 mg total) by mouth 2 (two) times daily. 28 tablet 0  . phentermine (ADIPEX-P) 37.5 MG tablet Take 37.5 mg by mouth daily before breakfast.     No current facility-administered medications on file prior to visit.    Review of Systems:  As per HPI- otherwise negative.   Physical Examination: Filed Vitals:   03/03/14 1613  BP: 144/88  Pulse: 88  Temp: 98.4 F (36.9 C)  Resp: 16   Filed Vitals:   03/03/14 1613  Height: 5\' 4"  (1.626 m)  Weight: 183 lb (83.008 kg)   Body mass index is 31.4 kg/(m^2). Ideal Body Weight: Weight in (lb) to have BMI = 25: 145.3  GEN: WDWN, NAD, Non-toxic, A & O x 3, obese, looks well HEENT: Atraumatic, Normocephalic. Neck supple. No masses, No LAD. Ears and Nose: No external deformity. CV: RRR, No M/G/R. No JVD. No thrill. No  extra heart sounds. PULM: CTA B, no wheezes, crackles, rhonchi. No retractions. No resp. distress. No accessory muscle use. ABD: S, ND, +BS. No rebound. No HSM.  She has mild suprapubic tenderness, notes that her bilateral lower back feels sore but does not have distinct CVA tenderness EXTR: No c/c/e NEURO Normal gait.  PSYCH: Normally interactive. Conversant. Not depressed or anxious appearing.  Calm demeanor.   Results for orders placed or performed in visit on 03/03/14  POCT UA - Microscopic Only  Result Value Ref Range   WBC, Ur, HPF, POC 4-8    RBC, urine, microscopic neg    Bacteria, U Microscopic 4+    Mucus, UA neg    Epithelial cells, urine per micros 0-2    Crystals, Ur, HPF, POC neg    Casts, Ur, LPF, POC neg    Yeast, UA neg   POCT urinalysis  dipstick  Result Value Ref Range   Color, UA yellow    Clarity, UA cloudy    Glucose, UA neg    Bilirubin, UA neg    Ketones, UA neg    Spec Grav, UA 1.025    Blood, UA neg    pH, UA 6.0    Protein, UA neg    Urobilinogen, UA 0.2    Nitrite, UA positive    Leukocytes, UA Trace   POCT urine pregnancy  Result Value Ref Range   Preg Test, Ur Negative   POCT Wet Prep with KOH  Result Value Ref Range   Trichomonas, UA Negative    Clue Cells Wet Prep HPF POC 3-5    Epithelial Wet Prep HPF POC 6-10    Yeast Wet Prep HPF POC NEG    Bacteria Wet Prep HPF POC 2+    RBC Wet Prep HPF POC NEG    WBC Wet Prep HPF POC 0-3    KOH Prep POC Negative     Assessment and Plan: Acute cystitis without hematuria - Plan: ciprofloxacin (CIPRO) 500 MG tablet, Urine culture  Urinary frequency - Plan: POCT UA - Microscopic Only, POCT urinalysis dipstick, POCT urine pregnancy, POCT Wet Prep with KOH  Abnormal urine odor - Plan: GC/Chlamydia Probe Amp  Bacterial vaginosis - Plan: metroNIDAZOLE (FLAGYL) 500 MG tablet  Treat today for a UTI- await culture.  Also gave her an rx for flagyl to hold and use once she is done with cipro for BV.  Await other labs and will be in touch with her.    Meds ordered this encounter  Medications  . ciprofloxacin (CIPRO) 500 MG tablet    Sig: Take 1 tablet (500 mg total) by mouth 2 (two) times daily.    Dispense:  14 tablet    Refill:  0  . metroNIDAZOLE (FLAGYL) 500 MG tablet    Sig: Take 1 tablet (500 mg total) by mouth 2 (two) times daily. Take 1 pill twice daily for one week. NO alcohol    Dispense:  14 tablet    Refill:  0     Signed Jessica Copland, MD  11/5: final urine culture shows that she does have an e coli UTI sensitive to cipro.  Called and LMOM that all labs look good, send a copy in the mail   Results for orders placed or performed in visit on 03/03/14  GC/Chlamydia Probe Amp  Result Value Ref Range   CT Probe RNA NEGATIVE    GC Probe RNA  NEGATIVE   Urine culture  Result Value Ref Range  Culture ESCHERICHIA COLI    Colony Count >=100,000 COLONIES/ML    Organism ID, Bacteria ESCHERICHIA COLI       Susceptibility   Escherichia coli -  (no method available)    AMPICILLIN >=32 Resistant     AMOX/CLAVULANIC 4 Sensitive     AMPICILLIN/SULBACTAM 4 Sensitive     PIP/TAZO <=4 Sensitive     IMIPENEM <=0.25 Sensitive     CEFAZOLIN <=4 Sensitive     CEFTRIAXONE <=1 Sensitive     CEFTAZIDIME <=1 Sensitive     CEFEPIME <=1 Sensitive     GENTAMICIN <=1 Sensitive     TOBRAMYCIN <=1 Sensitive     CIPROFLOXACIN <=0.25 Sensitive     LEVOFLOXACIN 0.25 Sensitive     NITROFURANTOIN <=16 Sensitive     TRIMETH/SULFA >=320 Resistant   POCT UA - Microscopic Only  Result Value Ref Range   WBC, Ur, HPF, POC 4-8    RBC, urine, microscopic neg    Bacteria, U Microscopic 4+    Mucus, UA neg    Epithelial cells, urine per micros 0-2    Crystals, Ur, HPF, POC neg    Casts, Ur, LPF, POC neg    Yeast, UA neg   POCT urinalysis dipstick  Result Value Ref Range   Color, UA yellow    Clarity, UA cloudy    Glucose, UA neg    Bilirubin, UA neg    Ketones, UA neg    Spec Grav, UA 1.025    Blood, UA neg    pH, UA 6.0    Protein, UA neg    Urobilinogen, UA 0.2    Nitrite, UA positive    Leukocytes, UA Trace   POCT urine pregnancy  Result Value Ref Range   Preg Test, Ur Negative   POCT Wet Prep with KOH  Result Value Ref Range   Trichomonas, UA Negative    Clue Cells Wet Prep HPF POC 3-5    Epithelial Wet Prep HPF POC 6-10    Yeast Wet Prep HPF POC NEG    Bacteria Wet Prep HPF POC 2+    RBC Wet Prep HPF POC NEG    WBC Wet Prep HPF POC 0-3    KOH Prep POC Negative

## 2014-03-03 NOTE — Patient Instructions (Signed)
Please use the cipro as directed for UTI.  I will be in touch with the rest of your labs and urine culture.   If you continue to feel like you have a bacterial infection go ahead and take the flagyl once you finish the cipro

## 2014-03-04 LAB — GC/CHLAMYDIA PROBE AMP
CT PROBE, AMP APTIMA: NEGATIVE
GC Probe RNA: NEGATIVE

## 2014-03-06 ENCOUNTER — Encounter: Payer: Self-pay | Admitting: Family Medicine

## 2014-03-06 LAB — URINE CULTURE

## 2014-08-01 ENCOUNTER — Ambulatory Visit (INDEPENDENT_AMBULATORY_CARE_PROVIDER_SITE_OTHER): Payer: 59 | Admitting: Physician Assistant

## 2014-08-01 VITALS — BP 162/100 | HR 89 | Temp 98.3°F | Resp 18 | Ht 65.0 in | Wt 188.4 lb

## 2014-08-01 DIAGNOSIS — N898 Other specified noninflammatory disorders of vagina: Secondary | ICD-10-CM

## 2014-08-01 DIAGNOSIS — Z202 Contact with and (suspected) exposure to infections with a predominantly sexual mode of transmission: Secondary | ICD-10-CM | POA: Diagnosis not present

## 2014-08-01 DIAGNOSIS — Z6831 Body mass index (BMI) 31.0-31.9, adult: Secondary | ICD-10-CM | POA: Insufficient documentation

## 2014-08-01 DIAGNOSIS — R300 Dysuria: Secondary | ICD-10-CM

## 2014-08-01 DIAGNOSIS — Z113 Encounter for screening for infections with a predominantly sexual mode of transmission: Secondary | ICD-10-CM | POA: Diagnosis not present

## 2014-08-01 LAB — POCT WET PREP WITH KOH
CLUE CELLS WET PREP PER HPF POC: NEGATIVE
KOH Prep POC: NEGATIVE
RBC WET PREP PER HPF POC: NEGATIVE
Trichomonas, UA: NEGATIVE
Yeast Wet Prep HPF POC: NEGATIVE

## 2014-08-01 LAB — POCT UA - MICROSCOPIC ONLY
CASTS, UR, LPF, POC: NEGATIVE
Crystals, Ur, HPF, POC: NEGATIVE
MUCUS UA: NEGATIVE
RBC, urine, microscopic: NEGATIVE
YEAST UA: NEGATIVE

## 2014-08-01 LAB — POCT URINALYSIS DIPSTICK
BILIRUBIN UA: NEGATIVE
Blood, UA: NEGATIVE
GLUCOSE UA: NEGATIVE
LEUKOCYTES UA: NEGATIVE
NITRITE UA: NEGATIVE
Protein, UA: NEGATIVE
Spec Grav, UA: 1.025
UROBILINOGEN UA: 0.2
pH, UA: 5.5

## 2014-08-01 MED ORDER — AZITHROMYCIN 250 MG PO TABS: 1000.0000 mg | ORAL_TABLET | Freq: Once | ORAL | Status: DC

## 2014-08-01 MED ORDER — FLUCONAZOLE 150 MG PO TABS: 150.0000 mg | ORAL_TABLET | Freq: Once | ORAL | Status: DC

## 2014-08-01 MED ORDER — CEFTRIAXONE SODIUM 1 G IJ SOLR
250.0000 mg | Freq: Once | INTRAMUSCULAR | Status: AC
  Administered 2014-08-01: 250 mg via INTRAMUSCULAR

## 2014-08-01 NOTE — Patient Instructions (Addendum)
I will contact you with your lab results as soon as they are available.   If you have not heard from me in 2 weeks, please contact me.  The fastest way to get your results is to register for My Chart (see the instructions on the last page of this printout).  Please check your blood pressure in the next several days (or you may return here). If it remains above 140/90, we'll need to consider starting medication to lower it, though I suspect it is elevated today due to the stressful situation.  I recommend abstinence from sexual activity for 2 weeks, and then consistent condom use.

## 2014-08-01 NOTE — Progress Notes (Signed)
Patient ID: Kaitlyn Rasmussen, female    DOB: April 20, 1976, 39 y.o.   MRN: [id number]  PCP: Philis Fendt, MD  Subjective:   Chief Complaint  Patient presents with  . Exposure to STD  . Vaginal Discharge    states it smells  . urine odor    HPI  Presents for evaluation of more than a week of atypical vaginal discharge, itching, burning and urine odor. Used an OTC product to treat a possible yeast or BV infection, without resolution. She spoke with her ex-boyfriend's partner, who reports that she has both trich and gonorrhea, though the man has denied the possibility that he could have given her any type of infection.  Some urinary urgency, frequency, burning with urination. No hematuria. No dyspareunia. No fever, chills. Occasional nausea. No vomiting. Occasional mild lower abdominal discomfort.  Review of Systems Review of Systems As above.    Patient Active Problem List   Diagnosis Date Noted  . BMI 31.0-31.9,adult 08/01/2014  . RHINITIS, ALLERGIC, DUE TO POLLEN 09/06/2006  . Vaginal leukorrhea 08/08/2006  . GOITER NOS 06/29/2006  . TOBACCO DEPENDENCE 06/29/2006  . HEARTBURN 06/29/2006     Prior to Admission medications   Medication Sig Start Date End Date Taking? Authorizing Provider  levothyroxine (SYNTHROID, LEVOTHROID) 100 MCG tablet Take 100 mcg by mouth daily.   Yes Historical Provider, MD     No Known Allergies     Objective:  Physical Exam  Physical Exam  Constitutional: She is oriented to person, place, and time. She appears well-developed and well-nourished. She is active and cooperative. No distress.  BP 162/100 mmHg  Pulse 89  Temp(Src) 98.3 F (36.8 C) (Oral)  Resp 18  Ht 5\' 5"  (1.651 m)  Wt 188 lb 6.4 oz (85.458 kg)  BMI 31.35 kg/m2  SpO2 100%  LMP 07/23/2014 (Exact Date)   Eyes: Conjunctivae are normal.  Pulmonary/Chest: Effort normal.  Abdominal: Soft. Bowel sounds are normal. She exhibits no distension and no mass. There is no  tenderness. There is no rebound and no guarding. Hernia confirmed negative in the right inguinal area and confirmed negative in the left inguinal area.  Genitourinary: Uterus normal. Rectal exam shows no external hemorrhoid and no fissure. Pelvic exam was performed with patient supine. No labial fusion. There is no rash, tenderness, lesion or injury on the right labia. There is no rash, tenderness, lesion or injury on the left labia. Cervix exhibits no motion tenderness, no discharge and no friability. Right adnexum displays no mass, no tenderness and no fullness. Left adnexum displays no mass, no tenderness and no fullness. No erythema, tenderness or bleeding in the vagina. No foreign body around the vagina. No signs of injury around the vagina. Vaginal discharge (minimal) found.  Lymphadenopathy:       Right: No inguinal adenopathy present.       Left: No inguinal adenopathy present.  Neurological: She is alert and oriented to person, place, and time.  Skin: Skin is warm and dry.  Psychiatric: She has a normal mood and affect. Her speech is normal and behavior is normal.       Results for orders placed or performed in visit on 08/01/14  POCT Wet Prep with KOH  Result Value Ref Range   Trichomonas, UA Negative    Clue Cells Wet Prep HPF POC neg    Epithelial Wet Prep HPF POC 1-3    Yeast Wet Prep HPF POC neg    Bacteria Wet Prep HPF POC  trace    RBC Wet Prep HPF POC neg    WBC Wet Prep HPF POC 0-1    KOH Prep POC Negative   POCT UA - Microscopic Only  Result Value Ref Range   WBC, Ur, HPF, POC 1-3    RBC, urine, microscopic neg    Bacteria, U Microscopic trace    Mucus, UA neg    Epithelial cells, urine per micros 1-3    Crystals, Ur, HPF, POC neg    Casts, Ur, LPF, POC neg    Yeast, UA neg   POCT urinalysis dipstick  Result Value Ref Range   Color, UA yellow    Clarity, UA clear    Glucose, UA neg    Bilirubin, UA neg    Ketones, UA trace    Spec Grav, UA 1.025    Blood,  UA neg    pH, UA 5.5    Protein, UA neg    Urobilinogen, UA 0.2    Nitrite, UA neg    Leukocytes, UA Negative        Assessment & Plan:  1. Vaginal leukorrhea Await remaining labs. No evidence of BV, yeast or trichomonas. - POCT Wet Prep with KOH  2. Exposure to gonorrhea Cover for exposure to gonorrhea. Await remaining labs. Abstain from sex x 2 weeks. - GC/Chlamydia Probe Amp - azithromycin (ZITHROMAX) 250 MG tablet; Take 4 tablets (1,000 mg total) by mouth once.  Dispense: 4 tablet; Refill: 0 - cefTRIAXone (ROCEPHIN) injection 250 mg; Inject 0.25 g (250 mg total) into the muscle once. - fluconazole (DIFLUCAN) 150 MG tablet; Take 1 tablet (150 mg total) by mouth once. Repeat if needed  Dispense: 2 tablet; Refill: 0  3. Routine screening for STI (sexually transmitted infection) Await remaining labs. Encouraged safer sex practices when resumes intercourse, not less than 2 weeks. - Hepatitis B surface antigen - Hepatitis C antibody - Hepatitis B surface antibody - HIV antibody - HSV(herpes simplex vrs) 1+2 ab-IgG - RPR  4. Dysuria No evidence of UTI. Suspect vaginal discharge is causing symptom. - POCT UA - Microscopic Only - POCT urinalysis dipstick   Fara Chute, PA-C Physician Assistant-Certified [facility]

## 2014-08-02 LAB — HEPATITIS B SURFACE ANTIBODY, QUANTITATIVE: Hepatitis B-Post: 0.4 m[IU]/mL

## 2014-08-02 LAB — HEPATITIS B SURFACE ANTIGEN: Hepatitis B Surface Ag: NEGATIVE

## 2014-08-02 LAB — RPR

## 2014-08-02 LAB — HEPATITIS C ANTIBODY: HCV AB: NEGATIVE

## 2014-08-02 LAB — HIV ANTIBODY (ROUTINE TESTING W REFLEX): HIV 1&2 Ab, 4th Generation: NONREACTIVE

## 2014-08-04 LAB — GC/CHLAMYDIA PROBE AMP
CT Probe RNA: NEGATIVE
GC PROBE AMP APTIMA: NEGATIVE

## 2014-08-04 LAB — HSV(HERPES SIMPLEX VRS) I + II AB-IGG
HSV 1 Glycoprotein G Ab, IgG: 7.15 IV — ABNORMAL HIGH
HSV 2 Glycoprotein G Ab, IgG: 7.55 IV — ABNORMAL HIGH

## 2014-08-05 ENCOUNTER — Encounter: Payer: Self-pay | Admitting: Physician Assistant

## 2018-06-07 ENCOUNTER — Other Ambulatory Visit: Payer: Self-pay | Admitting: Internal Medicine

## 2018-06-07 DIAGNOSIS — Z1231 Encounter for screening mammogram for malignant neoplasm of breast: Secondary | ICD-10-CM

## 2018-07-06 ENCOUNTER — Ambulatory Visit: Payer: Self-pay | Admitting: Surgery

## 2018-07-06 NOTE — H&P (Signed)
ETHELINE GEPPERT Documented: 07/06/2018 10:30 AM Location: [facility] Patient #: [id number] DOB: 06/20/1975 Single / Language: Cleophus Molt / Race: Black or African American Female  History of Present Illness Marcello Moores A. Ashelyn Mccravy MD; 07/06/2018 11:37 AM) Patient words: Patient sent at the request Of Dr Jackson Latino for thyroid goiter. She's had this for over 10 years. Her last ultrasound was recently resected goiter but she did not have the report with her today. I reviewed her ultrasound report from 2014 that showed a large multinodular goiter. Biopsies with final aspiration was negative for cancer in 2014 she said. The thyroid is slightly larger than to about the same size . She complains of a globus sensation and snoring when she sleeps. It has become more compressive she thinks. Her voice is not changing nor she having difficulty swallowing but she feels constant pressure in her neck.  The patient is a 43 year old female.   Past Surgical History Emeline Gins, [place]; 07/06/2018 10:30 AM) No pertinent past surgical history  Diagnostic Studies History Emeline Gins, [place]; 07/06/2018 10:30 AM) Colonoscopy 5-10 years ago Mammogram >3 years ago  Allergies Emeline Gins, [place]; 07/06/2018 10:31 AM) No Known Drug Allergies [07/06/2018]: Allergies Reconciled  Medication History Emeline Gins, CMA; 07/06/2018 10:31 AM) Vitamin D (Ergocalciferol) (50000UNIT Capsule, Oral) Active. Medications Reconciled  Social History Emeline Gins, [place]; 07/06/2018 10:30 AM) Alcohol use Recently quit alcohol use. Caffeine use Carbonated beverages. No drug use Tobacco use Current some day smoker.  Family History Emeline Gins, [place]; 07/06/2018 10:30 AM) Breast Cancer Mother. Hypertension Mother. Ovarian Cancer Sister. Thyroid problems Family Members In General.  Pregnancy / Birth History Emeline Gins, [place]; 07/06/2018 10:30 AM) Age at menarche 31 years. Gravida 3 Maternal age  <15 Para 3 Regular periods  Other Problems Emeline Gins, CMA; 07/06/2018 10:30 AM) Bladder Problems Sleep Apnea Thyroid Disease     Review of Systems (Kirkland Figg A. Romy Ipock MD; 07/06/2018 11:39 AM) General Present- Fatigue and Weight Gain. Not Present- Appetite Loss, Chills, Fever, Night Sweats and Weight Loss. Skin Present- Dryness. Not Present- Change in Wart/Mole, Hives, Jaundice, New Lesions, Non-Healing Wounds, Rash and Ulcer. HEENT Present- Wears glasses/contact lenses. Not Present- Earache, Hearing Loss, Hoarseness, Nose Bleed, Oral Ulcers, Ringing in the Ears, Seasonal Allergies, Sinus Pain, Sore Throat, Visual Disturbances and Yellow Eyes. Respiratory Present- Snoring and Wheezing. Not Present- Bloody sputum, Chronic Cough and Difficulty Breathing. Breast Not Present- Breast Mass, Breast Pain, Nipple Discharge and Skin Changes. Cardiovascular Present- Difficulty Breathing Lying Down. Not Present- Chest Pain, Leg Cramps, Palpitations, Rapid Heart Rate, Shortness of Breath and Swelling of Extremities. Gastrointestinal Present- Bloating and Excessive gas. Not Present- Abdominal Pain, Bloody Stool, Change in Bowel Habits, Chronic diarrhea, Constipation, Difficulty Swallowing, Gets full quickly at meals, Hemorrhoids, Indigestion, Nausea, Rectal Pain and Vomiting. Female Genitourinary Not Present- Frequency, Nocturia, Painful Urination, Pelvic Pain and Urgency. Musculoskeletal Not Present- Back Pain, Joint Pain, Joint Stiffness, Muscle Pain, Muscle Weakness and Swelling of Extremities. Neurological Not Present- Decreased Memory, Fainting, Headaches, Numbness, Seizures, Tingling, Tremor, Trouble walking and Weakness. Endocrine Present- Excessive Hunger and Hair Changes. Not Present- Cold Intolerance, Heat Intolerance, Hot flashes and New Diabetes. Hematology Not Present- Blood Thinners, Easy Bruising, Excessive bleeding, Gland problems, HIV and Persistent Infections. All other systems  negative  Vitals Emeline Gins CMA; 07/06/2018 10:31 AM) 07/06/2018 10:30 AM Weight: 183 lb Height: 65in Body Surface Area: 1.9 m Body Mass Index: 30.45 kg/m  Temp.: 98.47F  Pulse: 90 (Regular)  BP: 150/88 (Sitting, Left Arm, Standard)  Physical Exam (Naphtali Riede A. Jadzia Ibsen MD; 07/06/2018 11:38 AM)  Head and Neck Note: Extremely large bulbar multinodular goiter. No hoarseness. Trachea is midline. No apparent substernal extension.  Eye Eyeball - Bilateral-Extraocular movements intact. Sclera/Conjunctiva - Bilateral-No scleral icterus.  Chest and Lung Exam Chest and lung exam reveals -quiet, even and easy respiratory effort with no use of accessory muscles and on auscultation, normal breath sounds, no adventitious sounds and normal vocal resonance. Inspection Chest Wall - Normal. Back - normal.  Cardiovascular Cardiovascular examination reveals -normal heart sounds, regular rate and rhythm with no murmurs and normal pedal pulses bilaterally.  Neurologic Neurologic evaluation reveals -alert and oriented x 3 with no impairment of recent or remote memory. Mental Status-Normal.  Musculoskeletal Normal Exam - Left-Upper Extremity Strength Normal and Lower Extremity Strength Normal. Normal Exam - Right-Upper Extremity Strength Normal and Lower Extremity Strength Normal.  Lymphatic Head & Neck  General Head & Neck Lymphatics: Bilateral - Description - Normal. Axillary  General Axillary Region: Bilateral - Description - Normal. Tenderness - Non Tender.    Assessment & Plan (Cassandre Oleksy A. Deztinee Lohmeyer MD; 07/06/2018 11:39 AM)  Prescilla Sours GOITER (E04.2) Impression: The patient desires removal of her large thyroid gland. We'll obtain a CT scan to look for potential substernal involvement. We'll check preoperative laboratory studies including thyroid studies. Her calcium is 9.6 preoperatively. I discussed potential increased risk of recurrent laryngeal nerve  injury, hoarseness, low calcium and permanent injury to parathyroid glands. This is a large goiter. I'm not sure preoperative eye exam would be of much use nor thyroid suppression at this point. I discussed potential finding of thyroid malignancy but recommended total thyroidectomy to clear the large goiter since she is now having symptoms and has been dealing with this for a number of years. Risk of bleeding, infection, low calcium, permanent hoarseness, airway compromise, hoarseness, death, DVT, cardiovascular event, potential risks of surgery discussed as well as continued nonmedical management if desired.   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 Pathophysiology of thyroid problems were discussed. Options were discussed, and I made a recommendation to remove part (and possibly all) of the thyroid gland to treat the pathology.  Risks of bleeding, infection, injury to other organs including nerves, reoperation, low calcium voice box injury airway injury and death, and other risks were discussed. I noted a good likelihood this will help address the problem. While there are risks, I feel the risks of nonoperative management are greater; therefore, I feel surgery offers the best option. Educational material was given to help further explain the topics & concerns from our discussion. We will work to minimize complications.  You are being scheduled for surgery- Our schedulers will call you.  You should hear from our office's scheduling department within 5 working days about the location, date, and time of surgery. We try to make accommodations for patient's preferences in scheduling surgery, but sometimes the OR schedule or the surgeon's schedule prevents [place] from making those accommodations.  If you have not heard from our office [phone]) in 5 working days, call the office and ask for your surgeon's  nurse.  If you have other questions about your diagnosis, plan, or surgery, call the office and ask for your surgeon's nurse.  Pt Education - Pamphlet Given - The Thyroid Book: discussed with patient and provided information. Pt Education - [facility] Thyroid/ Parathyroid HCI

## 2018-07-12 ENCOUNTER — Other Ambulatory Visit: Payer: Self-pay | Admitting: Surgery

## 2018-07-13 ENCOUNTER — Other Ambulatory Visit: Payer: Self-pay | Admitting: Surgery

## 2018-07-13 DIAGNOSIS — E042 Nontoxic multinodular goiter: Secondary | ICD-10-CM

## 2018-07-18 ENCOUNTER — Ambulatory Visit
Admission: RE | Admit: 2018-07-18 | Discharge: 2018-07-18 | Disposition: A | Discharge status: Home / Self Care | Payer: BC Managed Care – PPO | Source: Ambulatory Visit | Attending: Surgery | Admitting: Surgery

## 2018-07-18 ENCOUNTER — Other Ambulatory Visit: Payer: Self-pay

## 2018-07-18 DIAGNOSIS — E042 Nontoxic multinodular goiter: Secondary | ICD-10-CM

## 2018-07-18 MED ORDER — IOPAMIDOL (ISOVUE-300) INJECTION 61%
75.0000 mL | Freq: Once | INTRAVENOUS | Status: AC | PRN
  Administered 2018-07-18: 75 mL via INTRAVENOUS

## 2018-07-23 ENCOUNTER — Ambulatory Visit: Payer: Self-pay

## 2018-08-16 ENCOUNTER — Inpatient Hospital Stay (HOSPITAL_COMMUNITY): Admission: RE | Admit: 2018-08-16 | Payer: BC Managed Care – PPO | Source: Ambulatory Visit

## 2018-08-20 ENCOUNTER — Ambulatory Visit: Payer: Self-pay

## 2018-08-22 ENCOUNTER — Encounter (HOSPITAL_COMMUNITY): Admission: RE | Payer: Self-pay | Source: Home / Self Care

## 2018-08-22 ENCOUNTER — Ambulatory Visit (HOSPITAL_COMMUNITY): Admission: RE | Admit: 2018-08-22 | Payer: BC Managed Care – PPO | Source: Home / Self Care | Admitting: Surgery

## 2018-08-22 SURGERY — THYROIDECTOMY
Anesthesia: General

## 2018-09-06 ENCOUNTER — Ambulatory Visit: Payer: Self-pay | Admitting: Surgery

## 2018-10-01 ENCOUNTER — Other Ambulatory Visit: Payer: Self-pay | Admitting: Internal Medicine

## 2018-10-01 ENCOUNTER — Ambulatory Visit
Admission: RE | Admit: 2018-10-01 | Discharge: 2018-10-01 | Disposition: A | Discharge status: Home / Self Care | Payer: BC Managed Care – PPO | Source: Ambulatory Visit | Attending: Internal Medicine | Admitting: Internal Medicine

## 2018-10-01 ENCOUNTER — Other Ambulatory Visit: Payer: Self-pay

## 2018-10-01 DIAGNOSIS — Z1231 Encounter for screening mammogram for malignant neoplasm of breast: Secondary | ICD-10-CM

## 2018-10-25 ENCOUNTER — Ambulatory Visit: Admit: 2018-10-25 | Payer: BC Managed Care – PPO | Admitting: Surgery

## 2018-10-25 SURGERY — THYROIDECTOMY
Anesthesia: General

## 2018-11-27 NOTE — Pre-Procedure Instructions (Signed)
[id number] - [place], [place] AT [address] [place] [address] Phone: [phone] Fax: [phone]  [place] ([place]), [place] - [address] [place] [address] Phone: [phone] Fax: [phone]  [id number] - [place], [place] [address] [place] [address] Phone: [phone] Fax: [phone]      Your procedure is scheduled on 12-04-18 from 0730-1000  Report to [facility] Main Entrance "A" at 0530 A.M., and check in at the Admitting office.  Call this number if you have problems the morning of surgery:  [phone]  Call [phone] if you have any questions prior to your surgery date Monday-Friday 8am-4pm    Remember:  Do not eat or drink after midnight the night before your surgery    Take these medicines the morning of surgery with A SIP OF WATER :none  7 days prior to surgery STOP taking any Aspirin (unless otherwise instructed by your surgeon), Aleve, Naproxen, Ibuprofen, Motrin, Advil, Goody's, BC's, all herbal medications, fish oil, and all vitamins.    The Morning of Surgery  Do not wear jewelry, make-up or nail polish.  Do not wear lotions, powders, or perfumes/colognes, or deodorant  Do not shave 48 hours prior to surgery.   Do not bring valuables to the hospital.  [facility] is not responsible for any belongings or valuables.  If you are a smoker, DO NOT Smoke 24 hours prior to surgery IF you wear a CPAP at night please bring your mask, tubing, and machine the morning of surgery   Remember that you must have someone to transport you home after your surgery, and remain with you for 24 hours if you are discharged the same day.   Contacts, glasses, hearing aids, dentures or bridgework may not be worn into surgery.    Leave your suitcase in the car.  After  surgery it may be brought to your room.  For patients admitted to the hospital, discharge time will be determined by your treatment team.  Patients discharged the day of surgery will not be allowed to drive home.    Special instructions:   [place]- Preparing For Surgery  Before surgery, you can play an important role. Because skin is not sterile, your skin needs to be as free of germs as possible. You can reduce the number of germs on your skin by washing with CHG (chlorahexidine gluconate) Soap before surgery.  CHG is an antiseptic cleaner which kills germs and bonds with the skin to continue killing germs even after washing.    Oral Hygiene is also important to reduce your risk of infection.  Remember - BRUSH YOUR TEETH THE MORNING OF SURGERY WITH YOUR REGULAR TOOTHPASTE  Please do not use if you have an allergy to CHG or antibacterial soaps. If your skin becomes reddened/irritated stop using the CHG.  Do not shave (including legs and underarms) for at least 48 hours prior to first CHG shower. It is OK to shave your face.  Please follow these instructions carefully.   1. Shower the NIGHT BEFORE SURGERY and the MORNING OF SURGERY with CHG Soap.   2. If you chose to wash your hair, wash your hair first as usual with your normal shampoo.  3. After you shampoo, rinse your hair and body thoroughly to remove the shampoo.  4. Use CHG as  you would any other liquid soap. You can apply CHG directly to the skin and wash gently with a scrungie or a clean washcloth.   5. Apply the CHG Soap to your body ONLY FROM THE NECK DOWN.  Do not use on open wounds or open sores. Avoid contact with your eyes, ears, mouth and genitals (private parts). Wash Face and genitals (private parts)  with your normal soap.   6. Wash thoroughly, paying special attention to the area where your surgery will be performed.  7. Thoroughly rinse your body with warm water from the neck down.  8. DO NOT shower/wash with  your normal soap after using and rinsing off the CHG Soap.  9. Pat yourself dry with a CLEAN TOWEL.  10. Wear CLEAN PAJAMAS to bed the night before surgery, wear comfortable clothes the morning of surgery  11. Place CLEAN SHEETS on your bed the night of your first shower and DO NOT SLEEP WITH PETS.  Day of Surgery:  Do not apply any deodorants/lotions. Please shower the morning of surgery with the CHG soap  Please wear clean clothes to the hospital/surgery center.   Remember to brush your teeth WITH YOUR REGULAR TOOTHPASTE.   Please read over the fact sheets that you were given.

## 2018-11-28 ENCOUNTER — Encounter (HOSPITAL_COMMUNITY): Payer: Self-pay

## 2018-11-28 ENCOUNTER — Other Ambulatory Visit: Payer: Self-pay

## 2018-11-28 ENCOUNTER — Encounter (HOSPITAL_COMMUNITY)
Admission: RE | Admit: 2018-11-28 | Discharge: 2018-11-28 | Disposition: A | Discharge status: Home / Self Care | Payer: BC Managed Care – PPO | Source: Ambulatory Visit | Attending: Surgery | Admitting: Surgery

## 2018-11-28 ENCOUNTER — Ambulatory Visit (HOSPITAL_COMMUNITY)
Admission: RE | Admit: 2018-11-28 | Discharge: 2018-11-28 | Disposition: A | Discharge status: Home / Self Care | Payer: BC Managed Care – PPO | Source: Ambulatory Visit | Attending: Anesthesiology | Admitting: Anesthesiology

## 2018-11-28 DIAGNOSIS — Z01818 Encounter for other preprocedural examination: Secondary | ICD-10-CM

## 2018-11-28 LAB — COMPREHENSIVE METABOLIC PANEL
ALT: 16 U/L (ref 0–44)
AST: 17 U/L (ref 15–41)
Albumin: 3.8 g/dL (ref 3.5–5.0)
Alkaline Phosphatase: 40 U/L (ref 38–126)
Anion gap: 6 (ref 5–15)
BUN: 15 mg/dL (ref 6–20)
CO2: 24 mmol/L (ref 22–32)
Calcium: 9.2 mg/dL (ref 8.9–10.3)
Chloride: 109 mmol/L (ref 98–111)
Creatinine, Ser: 0.89 mg/dL (ref 0.44–1.00)
GFR calc Af Amer: >60 mL/min (ref >60)
GFR calc non Af Amer: >60 mL/min (ref >60)
Glucose, Bld: 88 mg/dL (ref 70–99)
Potassium: 4.6 mmol/L (ref 3.5–5.1)
Sodium: 139 mmol/L (ref 135–145)
Total Bilirubin: 0.5 mg/dL (ref 0.3–1.2)
Total Protein: 7 g/dL (ref 6.5–8.1)

## 2018-11-28 LAB — CBC WITH DIFFERENTIAL/PLATELET
Abs Immature Granulocytes: 0.01 10*3/uL (ref 0.00–0.07)
Basophils Absolute: 0 10*3/uL (ref 0.0–0.1)
Basophils Relative: 1 %
Eosinophils Absolute: 0.1 10*3/uL (ref 0.0–0.5)
Eosinophils Relative: 2 %
HCT: 39.9 % (ref 36.0–46.0)
Hemoglobin: 12 g/dL (ref 12.0–15.0)
Immature Granulocytes: 0 %
Lymphocytes Relative: 43 %
Lymphs Abs: 1.8 10*3/uL (ref 0.7–4.0)
MCH: 29.9 pg (ref 26.0–34.0)
MCHC: 30.1 g/dL (ref 30.0–36.0)
MCV: 99.3 fL (ref 80.0–100.0)
Monocytes Absolute: 0.5 10*3/uL (ref 0.1–1.0)
Monocytes Relative: 12 %
Neutro Abs: 1.8 10*3/uL (ref 1.7–7.7)
Neutrophils Relative %: 42 %
Platelets: 270 10*3/uL (ref 150–400)
RBC: 4.02 MIL/uL (ref 3.87–5.11)
RDW: 13.9 % (ref 11.5–15.5)
WBC: 4.3 10*3/uL (ref 4.0–10.5)
nRBC: 0 % (ref 0.0–0.2)

## 2018-11-28 NOTE — Progress Notes (Signed)
PCP - Dr. Marica Otter Cardiologist - denies  Chest x-ray - 11/28/2018 EKG - denies Stress Test - denies  ECHO - denies Cardiac Cath - denies  Sleep Study - denies CPAP - N/A  Blood Thinner Instructions: N/A Aspirin Instructions: N/A  Anesthesia review: No  Coronavirus Screening  Have you experienced the following symptoms:  Cough yes/no: No Fever (>100.66F)  yes/no: No Runny nose yes/no: No Sore throat yes/no: No Difficulty breathing/shortness of breath  yes/no: No  Have you or a family member traveled in the last 14 days and where? yes/no: No  If the patient indicates "YES" to the above questions, their PAT will be rescheduled to limit the exposure to others and, the surgeon will be notified. THE PATIENT WILL NEED TO BE ASYMPTOMATIC FOR 14 DAYS.   If the patient is not experiencing any of these symptoms, the PAT nurse will instruct them to NOT bring anyone with them to their appointment since they may have these symptoms or traveled as well.   Please remind your patients and families that hospital visitation restrictions are in effect and the importance of the restrictions.   Patient denies shortness of breath, fever, cough and chest pain at PAT appointment  Patient verbalized understanding of instructions that were given to them at the PAT appointment. Patient was also instructed that they will need to review over the PAT instructions again at home before surgery.

## 2018-11-28 NOTE — H&P (Signed)
Location: [facility] Patient #: [id number] DOB: Aug 08, 1975 Single / Language: Kaitlyn Rasmussen / Race: Black or African American Female  History of Present Illness  Patient words: Patient sent at the request Of Dr Jackson Latino for thyroid goiter. She's had this for over 10 years. Her last ultrasound was recently resected goiter but she did not have the report with her today. I reviewed her ultrasound report from 2014 that showed a large multinodular goiter. Biopsies with final aspiration was negative for cancer in 2014 she said. The thyroid is slightly larger than to about the same size . She complains of a globus sensation and snoring when she sleeps. It has become more compressive she thinks. Her voice is not changing nor she having difficulty swallowing but she feels constant pressure in her neck.  The patient is a 43 year old female.   Past Surgical History  No pertinent past surgical history  Diagnostic Studies History  Colonoscopy 5-10 years ago Mammogram >3 years ago  Allergies  No Known Drug Allergies [07/06/2018]: Allergies Reconciled  Medication History  Vitamin D (Ergocalciferol) (50000UNIT Capsule, Oral) Active. Medications Reconciled  Social History Alcohol use Recently quit alcohol use. Caffeine use Carbonated beverages. No drug use Tobacco use Current some day smoker.  Family History  Breast Cancer Mother. Hypertension Mother. Ovarian Cancer Sister. Thyroid problems Family Members In General.  Pregnancy / Birth History  Age at menarche 58 years. Gravida 3 Maternal age <15 Para 3 Regular periods  Other Problems  Bladder Problems Sleep Apnea Thyroid Disease     Review of Systems  General Present- Fatigue and Weight Gain. Not Present- Appetite Loss, Chills, Fever, Night Sweats and Weight Loss. Skin Present- Dryness. Not Present- Change in Wart/Mole, Hives, Jaundice, New Lesions, Non-Healing Wounds, Rash and  Ulcer. HEENT Present- Wears glasses/contact lenses. Not Present- Earache, Hearing Loss, Hoarseness, Nose Bleed, Oral Ulcers, Ringing in the Ears, Seasonal Allergies, Sinus Pain, Sore Throat, Visual Disturbances and Yellow Eyes. Respiratory Present- Snoring and Wheezing. Not Present- Bloody sputum, Chronic Cough and Difficulty Breathing. Breast Not Present- Breast Mass, Breast Pain, Nipple Discharge and Skin Changes. Cardiovascular Present- Difficulty Breathing Lying Down. Not Present- Chest Pain, Leg Cramps, Palpitations, Rapid Heart Rate, Shortness of Breath and Swelling of Extremities. Gastrointestinal Present- Bloating and Excessive gas. Not Present- Abdominal Pain, Bloody Stool, Change in Bowel Habits, Chronic diarrhea, Constipation, Difficulty Swallowing, Gets full quickly at meals, Hemorrhoids, Indigestion, Nausea, Rectal Pain and Vomiting. Female Genitourinary Not Present- Frequency, Nocturia, Painful Urination, Pelvic Pain and Urgency. Musculoskeletal Not Present- Back Pain, Joint Pain, Joint Stiffness, Muscle Pain, Muscle Weakness and Swelling of Extremities. Neurological Not Present- Decreased Memory, Fainting, Headaches, Numbness, Seizures, Tingling, Tremor, Trouble walking and Weakness. Endocrine Present- Excessive Hunger and Hair Changes. Not Present- Cold Intolerance, Heat Intolerance, Hot flashes and New Diabetes. Hematology Not Present- Blood Thinners, Easy Bruising, Excessive bleeding, Gland problems, HIV and Persistent Infections. All other systems negative  Vitals  07/06/2018 10:30 AM Weight: 183 lb Height: 65in Body Surface Area: 1.9 m Body Mass Index: 30.45 kg/m  Temp.: 98.86F  Pulse: 90 (Regular)  BP: 150/88 (Sitting, Left Arm, Standard)      Physical Exam   Head and Neck Note: Extremely large bulbar multinodular goiter. No hoarseness. Trachea is midline. No apparent substernal extension.  Eye Eyeball - Bilateral-Extraocular movements  intact. Sclera/Conjunctiva - Bilateral-No scleral icterus.  Chest and Lung Exam Chest and lung exam reveals -quiet, even and easy respiratory effort with no use of accessory muscles and on auscultation, normal breath  sounds, no adventitious sounds and normal vocal resonance. Inspection Chest Wall - Normal. Back - normal.  Cardiovascular Cardiovascular examination reveals -normal heart sounds, regular rate and rhythm with no murmurs and normal pedal pulses bilaterally.  Neurologic Neurologic evaluation reveals -alert and oriented x 3 with no impairment of recent or remote memory. Mental Status-Normal.  Musculoskeletal Normal Exam - Left-Upper Extremity Strength Normal and Lower Extremity Strength Normal. Normal Exam - Right-Upper Extremity Strength Normal and Lower Extremity Strength Normal.  Lymphatic Head & Neck  General Head & Neck Lymphatics: Bilateral - Description - Normal. Axillary  General Axillary Region: Bilateral - Description - Normal. Tenderness - Non Tender.    Assessment & Plan   MULTINODULAR GOITER (E04.2) Impression: The patient desires removal of her large thyroid gland. We'll obtain a CT scan to look for potential substernal involvement. We'll check preoperative laboratory studies including thyroid studies. Her calcium is 9.6 preoperatively. I discussed potential increased risk of recurrent laryngeal nerve injury, hoarseness, low calcium and permanent injury to parathyroid glands. This is a large goiter. I'm not sure preoperative eye exam would be of much use nor thyroid suppression at this point. I discussed potential finding of thyroid malignancy but recommended total thyroidectomy to clear the large goiter since she is now having symptoms and has been dealing with this for a number of years. Risk of bleeding, infection, low calcium, permanent hoarseness, airway compromise, hoarseness, death, DVT, cardiovascular event, potential risks of  surgery discussed as well as continued nonmedical management if desired.   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 Pathophysiology of thyroid problems were discussed. Options were discussed, and I made a recommendation to remove part (and possibly all) of the thyroid gland to treat the pathology.  Risks of bleeding, infection, injury to other organs including nerves, reoperation, low calcium voice box injury airway injury and death, and other risks were discussed. I noted a good likelihood this will help address the problem. While there are risks, I feel the risks of nonoperative management are greater; therefore, I feel surgery offers the best option. Educational material was given to help further explain the topics & concerns from our discussion. We will work to minimize complications.  You are being scheduled for surgery- Our schedulers will call you.  You should hear from our office's scheduling department within 5 working days about the location, date, and time of surgery. We try to make accommodations for patient's preferences in scheduling surgery, but sometimes the OR schedule or the surgeon's schedule prevents [place] from making those accommodations.  If you have not heard from our office [phone]) in 5 working days, call the office and ask for your surgeon's nurse.  If you have other questions about your diagnosis, plan, or surgery, call the office and ask for your surgeon's nurse.  Pt Education - Pamphlet Given - The Thyroid Book: discussed with patient and provided information. Pt Education - [facility] Thyroid/ Parathyroid HCI

## 2018-11-30 ENCOUNTER — Other Ambulatory Visit (HOSPITAL_COMMUNITY)
Admission: RE | Admit: 2018-11-30 | Discharge: 2018-11-30 | Disposition: A | Discharge status: Home / Self Care | Payer: BC Managed Care – PPO | Source: Ambulatory Visit | Attending: Surgery | Admitting: Surgery

## 2018-11-30 ENCOUNTER — Other Ambulatory Visit: Payer: Self-pay | Admitting: *Deleted

## 2018-11-30 DIAGNOSIS — Z01812 Encounter for preprocedural laboratory examination: Secondary | ICD-10-CM | POA: Diagnosis not present

## 2018-11-30 DIAGNOSIS — Z20828 Contact with and (suspected) exposure to other viral communicable diseases: Secondary | ICD-10-CM | POA: Insufficient documentation

## 2018-11-30 DIAGNOSIS — E049 Nontoxic goiter, unspecified: Secondary | ICD-10-CM

## 2018-11-30 LAB — SARS CORONAVIRUS 2 (TAT 6-24 HRS): SARS Coronavirus 2: NEGATIVE

## 2018-12-03 MED ORDER — CEFAZOLIN SODIUM-DEXTROSE 2-4 GM/100ML-% IV SOLN
2.0000 g | INTRAVENOUS | Status: AC
  Administered 2018-12-04: 2 g via INTRAVENOUS
  Filled 2018-12-03: qty 100

## 2018-12-04 ENCOUNTER — Ambulatory Visit (HOSPITAL_COMMUNITY): Payer: BC Managed Care – PPO | Admitting: Anesthesiology

## 2018-12-04 ENCOUNTER — Encounter (HOSPITAL_COMMUNITY): Payer: Self-pay

## 2018-12-04 ENCOUNTER — Other Ambulatory Visit: Payer: Self-pay

## 2018-12-04 ENCOUNTER — Encounter (HOSPITAL_COMMUNITY)
Admission: RE | Disposition: A | Discharge status: Home / Self Care | Payer: Self-pay | Source: Home / Self Care | Attending: Surgery

## 2018-12-04 ENCOUNTER — Observation Stay (HOSPITAL_COMMUNITY)
Admission: RE | Admit: 2018-12-04 | Discharge: 2018-12-05 | Disposition: A | Discharge status: Home / Self Care | Payer: BC Managed Care – PPO | Attending: Surgery | Admitting: Surgery

## 2018-12-04 DIAGNOSIS — E049 Nontoxic goiter, unspecified: Secondary | ICD-10-CM

## 2018-12-04 DIAGNOSIS — F17200 Nicotine dependence, unspecified, uncomplicated: Secondary | ICD-10-CM | POA: Insufficient documentation

## 2018-12-04 DIAGNOSIS — Z79899 Other long term (current) drug therapy: Secondary | ICD-10-CM | POA: Diagnosis not present

## 2018-12-04 DIAGNOSIS — Z8249 Family history of ischemic heart disease and other diseases of the circulatory system: Secondary | ICD-10-CM | POA: Diagnosis not present

## 2018-12-04 DIAGNOSIS — E040 Nontoxic diffuse goiter: Secondary | ICD-10-CM | POA: Diagnosis present

## 2018-12-04 DIAGNOSIS — Z8041 Family history of malignant neoplasm of ovary: Secondary | ICD-10-CM | POA: Diagnosis not present

## 2018-12-04 DIAGNOSIS — Z791 Long term (current) use of non-steroidal anti-inflammatories (NSAID): Secondary | ICD-10-CM | POA: Diagnosis not present

## 2018-12-04 DIAGNOSIS — G4730 Sleep apnea, unspecified: Secondary | ICD-10-CM | POA: Diagnosis not present

## 2018-12-04 DIAGNOSIS — E042 Nontoxic multinodular goiter: Principal | ICD-10-CM | POA: Insufficient documentation

## 2018-12-04 DIAGNOSIS — Z803 Family history of malignant neoplasm of breast: Secondary | ICD-10-CM | POA: Insufficient documentation

## 2018-12-04 DIAGNOSIS — Z8349 Family history of other endocrine, nutritional and metabolic diseases: Secondary | ICD-10-CM | POA: Insufficient documentation

## 2018-12-04 DIAGNOSIS — F458 Other somatoform disorders: Secondary | ICD-10-CM | POA: Insufficient documentation

## 2018-12-04 HISTORY — PX: THYROIDECTOMY: SHX17

## 2018-12-04 LAB — COMPREHENSIVE METABOLIC PANEL
ALT: 20 U/L (ref 0–44)
AST: 24 U/L (ref 15–41)
Albumin: 3.9 g/dL (ref 3.5–5.0)
Alkaline Phosphatase: 38 U/L (ref 38–126)
Anion gap: 11 (ref 5–15)
BUN: 14 mg/dL (ref 6–20)
CO2: 21 mmol/L — ABNORMAL LOW (ref 22–32)
Calcium: 8.8 mg/dL — ABNORMAL LOW (ref 8.9–10.3)
Chloride: 101 mmol/L (ref 98–111)
Creatinine, Ser: 0.74 mg/dL (ref 0.44–1.00)
GFR calc Af Amer: >60 mL/min (ref >60)
GFR calc non Af Amer: >60 mL/min (ref >60)
Glucose, Bld: 165 mg/dL — ABNORMAL HIGH (ref 70–99)
Potassium: 4.3 mmol/L (ref 3.5–5.1)
Sodium: 133 mmol/L — ABNORMAL LOW (ref 135–145)
Total Bilirubin: 0.5 mg/dL (ref 0.3–1.2)
Total Protein: 7.1 g/dL (ref 6.5–8.1)

## 2018-12-04 LAB — ABO/RH: ABO/RH(D): A POS

## 2018-12-04 LAB — POCT PREGNANCY, URINE: Preg Test, Ur: NEGATIVE

## 2018-12-04 LAB — TYPE AND SCREEN
ABO/RH(D): A POS
Antibody Screen: NEGATIVE

## 2018-12-04 SURGERY — THYROIDECTOMY
Anesthesia: General

## 2018-12-04 MED ORDER — FENTANYL CITRATE (PF) 250 MCG/5ML IJ SOLN
INTRAMUSCULAR | Status: AC
  Filled 2018-12-04: qty 5

## 2018-12-04 MED ORDER — DIPHENHYDRAMINE HCL 50 MG/ML IJ SOLN
INTRAMUSCULAR | Status: DC | PRN
  Administered 2018-12-04: 12.5 mg via INTRAVENOUS

## 2018-12-04 MED ORDER — LACTATED RINGERS IV SOLN
INTRAVENOUS | Status: DC | PRN
  Administered 2018-12-04: 07:00:00 via INTRAVENOUS

## 2018-12-04 MED ORDER — DEXTROSE-NACL 5-0.9 % IV SOLN
INTRAVENOUS | Status: DC
  Administered 2018-12-04: 13:00:00 via INTRAVENOUS

## 2018-12-04 MED ORDER — DEXAMETHASONE SODIUM PHOSPHATE 10 MG/ML IJ SOLN
INTRAMUSCULAR | Status: DC | PRN
  Administered 2018-12-04: 10 mg via INTRAVENOUS

## 2018-12-04 MED ORDER — HEMOSTATIC AGENTS (NO CHARGE) OPTIME
TOPICAL | Status: DC | PRN
  Administered 2018-12-04: 1 via TOPICAL

## 2018-12-04 MED ORDER — CHLORHEXIDINE GLUCONATE CLOTH 2 % EX PADS: 6.0000 | MEDICATED_PAD | Freq: Once | CUTANEOUS | Status: DC

## 2018-12-04 MED ORDER — LIDOCAINE 2% (20 MG/ML) 5 ML SYRINGE
INTRAMUSCULAR | Status: AC
  Filled 2018-12-04: qty 5

## 2018-12-04 MED ORDER — LIDOCAINE 2% (20 MG/ML) 5 ML SYRINGE
INTRAMUSCULAR | Status: DC | PRN
  Administered 2018-12-04: 60 mg via INTRAVENOUS

## 2018-12-04 MED ORDER — FENTANYL CITRATE (PF) 250 MCG/5ML IJ SOLN
INTRAMUSCULAR | Status: DC | PRN
  Administered 2018-12-04 (×3): 50 ug via INTRAVENOUS
  Administered 2018-12-04: 150 ug via INTRAVENOUS

## 2018-12-04 MED ORDER — ONDANSETRON HCL 4 MG/2ML IJ SOLN: 4.0000 mg | Freq: Four times a day (QID) | INTRAMUSCULAR | Status: DC | PRN

## 2018-12-04 MED ORDER — PHENYLEPHRINE 40 MCG/ML (10ML) SYRINGE FOR IV PUSH (FOR BLOOD PRESSURE SUPPORT)
PREFILLED_SYRINGE | INTRAVENOUS | Status: AC
  Filled 2018-12-04: qty 10

## 2018-12-04 MED ORDER — ONDANSETRON HCL 4 MG/2ML IJ SOLN
INTRAMUSCULAR | Status: AC
  Filled 2018-12-04: qty 2

## 2018-12-04 MED ORDER — ACETAMINOPHEN 10 MG/ML IV SOLN
INTRAVENOUS | Status: AC
  Filled 2018-12-04: qty 100

## 2018-12-04 MED ORDER — FENTANYL CITRATE (PF) 100 MCG/2ML IJ SOLN: 25.0000 ug | INTRAMUSCULAR | Status: DC | PRN

## 2018-12-04 MED ORDER — GABAPENTIN 300 MG PO CAPS
300.0000 mg | ORAL_CAPSULE | ORAL | Status: AC
  Administered 2018-12-04: 300 mg via ORAL

## 2018-12-04 MED ORDER — ACETAMINOPHEN 500 MG PO TABS: 1000.0000 mg | ORAL_TABLET | Freq: Once | ORAL | Status: DC | PRN

## 2018-12-04 MED ORDER — FENTANYL CITRATE (PF) 100 MCG/2ML IJ SOLN: 50.0000 ug | INTRAMUSCULAR | Status: DC | PRN

## 2018-12-04 MED ORDER — 0.9 % SODIUM CHLORIDE (POUR BTL) OPTIME
TOPICAL | Status: DC | PRN
  Administered 2018-12-04 (×2): 1000 mL

## 2018-12-04 MED ORDER — OXYCODONE HCL 5 MG PO TABS: 5.0000 mg | ORAL_TABLET | Freq: Once | ORAL | Status: DC | PRN

## 2018-12-04 MED ORDER — OXYCODONE HCL 5 MG/5ML PO SOLN: 5.0000 mg | Freq: Once | ORAL | Status: DC | PRN

## 2018-12-04 MED ORDER — ACETAMINOPHEN 160 MG/5ML PO SOLN: 1000.0000 mg | Freq: Once | ORAL | Status: DC | PRN

## 2018-12-04 MED ORDER — ACETAMINOPHEN 500 MG PO TABS
ORAL_TABLET | ORAL | Status: AC
  Administered 2018-12-04: 06:00:00 1000 mg via ORAL
  Filled 2018-12-04: qty 2

## 2018-12-04 MED ORDER — DEXAMETHASONE SODIUM PHOSPHATE 10 MG/ML IJ SOLN
INTRAMUSCULAR | Status: AC
  Filled 2018-12-04: qty 1

## 2018-12-04 MED ORDER — ENOXAPARIN SODIUM 40 MG/0.4ML ~~LOC~~ SOLN
40.0000 mg | SUBCUTANEOUS | Status: DC
  Administered 2018-12-05: 08:00:00 40 mg via SUBCUTANEOUS
  Filled 2018-12-04: qty 0.4

## 2018-12-04 MED ORDER — MIDAZOLAM HCL 2 MG/2ML IJ SOLN
INTRAMUSCULAR | Status: DC | PRN
  Administered 2018-12-04: 2 mg via INTRAVENOUS

## 2018-12-04 MED ORDER — METHOCARBAMOL 500 MG PO TABS: 500.0000 mg | ORAL_TABLET | Freq: Four times a day (QID) | ORAL | Status: DC | PRN

## 2018-12-04 MED ORDER — GABAPENTIN 300 MG PO CAPS
300.0000 mg | ORAL_CAPSULE | Freq: Two times a day (BID) | ORAL | Status: DC
  Administered 2018-12-04 – 2018-12-05 (×3): 300 mg via ORAL
  Filled 2018-12-04 (×3): qty 1

## 2018-12-04 MED ORDER — ONDANSETRON HCL 4 MG/2ML IJ SOLN
INTRAMUSCULAR | Status: DC | PRN
  Administered 2018-12-04: 4 mg via INTRAVENOUS

## 2018-12-04 MED ORDER — ACETAMINOPHEN 10 MG/ML IV SOLN
1000.0000 mg | Freq: Once | INTRAVENOUS | Status: DC | PRN
  Administered 2018-12-04: 12:00:00 1000 mg via INTRAVENOUS

## 2018-12-04 MED ORDER — CEFAZOLIN SODIUM-DEXTROSE 2-4 GM/100ML-% IV SOLN
2.0000 g | Freq: Three times a day (TID) | INTRAVENOUS | Status: AC
  Administered 2018-12-04: 2 g via INTRAVENOUS
  Filled 2018-12-04: qty 100

## 2018-12-04 MED ORDER — DIPHENHYDRAMINE HCL 50 MG/ML IJ SOLN
INTRAMUSCULAR | Status: AC
  Filled 2018-12-04: qty 1

## 2018-12-04 MED ORDER — ONDANSETRON 4 MG PO TBDP: 4.0000 mg | ORAL_TABLET | Freq: Four times a day (QID) | ORAL | Status: DC | PRN

## 2018-12-04 MED ORDER — PROPOFOL 10 MG/ML IV BOLUS
INTRAVENOUS | Status: AC
  Filled 2018-12-04: qty 20

## 2018-12-04 MED ORDER — PROPOFOL 10 MG/ML IV BOLUS
INTRAVENOUS | Status: DC | PRN
  Administered 2018-12-04: 40 mg via INTRAVENOUS
  Administered 2018-12-04: 100 mg via INTRAVENOUS

## 2018-12-04 MED ORDER — ACETAMINOPHEN 500 MG PO TABS
1000.0000 mg | ORAL_TABLET | ORAL | Status: AC
  Administered 2018-12-04: 1000 mg via ORAL

## 2018-12-04 MED ORDER — SODIUM CHLORIDE 0.9 % IV SOLN
INTRAVENOUS | Status: DC | PRN
  Administered 2018-12-04: 25 ug/min via INTRAVENOUS

## 2018-12-04 MED ORDER — MIDAZOLAM HCL 2 MG/2ML IJ SOLN
INTRAMUSCULAR | Status: AC
  Filled 2018-12-04: qty 2

## 2018-12-04 MED ORDER — OXYCODONE HCL 5 MG PO TABS
5.0000 mg | ORAL_TABLET | ORAL | Status: DC | PRN
  Administered 2018-12-04 – 2018-12-05 (×5): 10 mg via ORAL
  Filled 2018-12-04 (×5): qty 2

## 2018-12-04 MED ORDER — ROCURONIUM BROMIDE 10 MG/ML (PF) SYRINGE
PREFILLED_SYRINGE | INTRAVENOUS | Status: AC
  Filled 2018-12-04: qty 10

## 2018-12-04 MED ORDER — GABAPENTIN 300 MG PO CAPS
ORAL_CAPSULE | ORAL | Status: AC
  Administered 2018-12-04: 06:00:00 300 mg via ORAL
  Filled 2018-12-04: qty 1

## 2018-12-04 MED ORDER — SUGAMMADEX SODIUM 200 MG/2ML IV SOLN
INTRAVENOUS | Status: DC | PRN
  Administered 2018-12-04: 166 mg via INTRAVENOUS

## 2018-12-04 MED ORDER — ROCURONIUM BROMIDE 10 MG/ML (PF) SYRINGE
PREFILLED_SYRINGE | INTRAVENOUS | Status: DC | PRN
  Administered 2018-12-04: 30 mg via INTRAVENOUS
  Administered 2018-12-04: 70 mg via INTRAVENOUS

## 2018-12-04 MED ORDER — PHENYLEPHRINE 40 MCG/ML (10ML) SYRINGE FOR IV PUSH (FOR BLOOD PRESSURE SUPPORT)
PREFILLED_SYRINGE | INTRAVENOUS | Status: DC | PRN
  Administered 2018-12-04: 80 ug via INTRAVENOUS

## 2018-12-04 SURGICAL SUPPLY — 46 items
ADH SKN CLS APL DERMABOND .7 (GAUZE/BANDAGES/DRESSINGS) ×1
CANISTER SUCT 3000ML PPV (MISCELLANEOUS) ×3 IMPLANT
CHLORAPREP W/TINT 10.5 ML (MISCELLANEOUS) ×3 IMPLANT
CLIP VESOCCLUDE MED 24/CT (CLIP) ×5 IMPLANT
CLIP VESOCCLUDE SM WIDE 24/CT (CLIP) ×3 IMPLANT
CONT SPEC 4OZ CLIKSEAL STRL BL (MISCELLANEOUS) IMPLANT
COVER SURGICAL LIGHT HANDLE (MISCELLANEOUS) ×3 IMPLANT
COVER WAND RF STERILE (DRAPES) ×1 IMPLANT
DERMABOND ADVANCED (GAUZE/BANDAGES/DRESSINGS) ×2
DERMABOND ADVANCED .7 DNX12 (GAUZE/BANDAGES/DRESSINGS) ×1 IMPLANT
DRAPE LAPAROSCOPIC ABDOMINAL (DRAPES) ×2 IMPLANT
DRAPE LAPAROTOMY 100X72 PEDS (DRAPES) ×1 IMPLANT
DRAPE UTILITY 15X26 TOWEL STRL (DRAPES) ×2 IMPLANT
ELECT CAUTERY BLADE 6.4 (BLADE) ×2 IMPLANT
ELECT REM PT RETURN 9FT ADLT (ELECTROSURGICAL) ×3
ELECTRODE REM PT RTRN 9FT ADLT (ELECTROSURGICAL) ×1 IMPLANT
GAUZE 4X4 16PLY RFD (DISPOSABLE) ×7 IMPLANT
GLOVE BIO SURGEON STRL SZ8 (GLOVE) ×5 IMPLANT
GLOVE BIOGEL PI IND STRL 8 (GLOVE) ×1 IMPLANT
GLOVE BIOGEL PI INDICATOR 8 (GLOVE) ×4
GOWN STRL REUS W/ TWL LRG LVL3 (GOWN DISPOSABLE) ×3 IMPLANT
GOWN STRL REUS W/ TWL XL LVL3 (GOWN DISPOSABLE) ×1 IMPLANT
GOWN STRL REUS W/TWL LRG LVL3 (GOWN DISPOSABLE) ×3
GOWN STRL REUS W/TWL XL LVL3 (GOWN DISPOSABLE) ×6
HEMOSTAT SNOW SURGICEL 2X4 (HEMOSTASIS) ×1 IMPLANT
HEMOSTAT SURGICEL 2X4 FIBR (HEMOSTASIS) ×2 IMPLANT
ILLUMINATOR WAVEGUIDE N/F (MISCELLANEOUS) ×2 IMPLANT
KIT BASIN OR (CUSTOM PROCEDURE TRAY) ×3 IMPLANT
KIT TURNOVER KIT B (KITS) ×3 IMPLANT
MANIFOLD NEPTUNE II (INSTRUMENTS) ×2 IMPLANT
NS IRRIG 1000ML POUR BTL (IV SOLUTION) ×5 IMPLANT
PACK GENERAL/GYN (CUSTOM PROCEDURE TRAY) ×3 IMPLANT
PAD ARMBOARD 7.5X6 YLW CONV (MISCELLANEOUS) ×6 IMPLANT
PENCIL SMOKE EVACUATOR (MISCELLANEOUS) ×3 IMPLANT
POSITIONER HEAD DONUT 9IN (MISCELLANEOUS) ×3 IMPLANT
SHEARS HARMONIC 9CM CVD (BLADE) ×3 IMPLANT
SPECIMEN JAR MEDIUM (MISCELLANEOUS) IMPLANT
SPONGE INTESTINAL PEANUT (DISPOSABLE) ×3 IMPLANT
STAPLER VISISTAT 35W (STAPLE) ×1 IMPLANT
SUT MNCRL AB 4-0 PS2 18 (SUTURE) ×3 IMPLANT
SUT VIC AB 2-0 SH 18 (SUTURE) ×5 IMPLANT
SUT VIC AB 3-0 SH 18 (SUTURE) ×3 IMPLANT
SUT VICRYL AB 2 0 TIES (SUTURE) ×3 IMPLANT
SUT VICRYL AB 3 0 TIES (SUTURE) ×3 IMPLANT
TOWEL GREEN STERILE (TOWEL DISPOSABLE) ×3 IMPLANT
TOWEL GREEN STERILE FF (TOWEL DISPOSABLE) ×3 IMPLANT

## 2018-12-04 NOTE — Anesthesia Preprocedure Evaluation (Signed)
Anesthesia Evaluation  Patient identified by MRN, date of birth, ID band Patient awake    Reviewed: Allergy & Precautions, NPO status , Patient's Chart, lab work & pertinent test results  History of Anesthesia Complications Negative for: history of anesthetic complications  Airway Mallampati: III  TM Distance: >3 FB Neck ROM: Full    Dental  (+) Teeth Intact, Dental Advisory Given   Pulmonary Current Smoker,    breath sounds clear to auscultation       Cardiovascular negative cardio ROS   Rhythm:Regular     Neuro/Psych neg Seizures PSYCHIATRIC DISORDERS Depression    GI/Hepatic negative GI ROS, Neg liver ROS,   Endo/Other  goiter  Renal/GU negative Renal ROS     Musculoskeletal negative musculoskeletal ROS (+)   Abdominal   Peds  Hematology negative hematology ROS (+)   Anesthesia Other Findings   Reproductive/Obstetrics                             Anesthesia Physical Anesthesia Plan  ASA: II  Anesthesia Plan: General   Post-op Pain Management:    Induction: Intravenous  PONV Risk Score and Plan: 2 and Ondansetron and Dexamethasone  Airway Management Planned: Oral ETT  Additional Equipment: Arterial line  Intra-op Plan:   Post-operative Plan: Extubation in OR  Informed Consent: I have reviewed the patients History and Physical, chart, labs and discussed the procedure including the risks, benefits and alternatives for the proposed anesthesia with the patient or authorized representative who has indicated his/her understanding and acceptance.     Dental advisory given  Plan Discussed with: CRNA and Surgeon  Anesthesia Plan Comments:         Anesthesia Quick Evaluation

## 2018-12-04 NOTE — Transfer of Care (Signed)
Immediate Anesthesia Transfer of Care Note  Patient: Kaitlyn Rasmussen   Procedure(s) Performed: TOTAL THYROIDECTOMY (N/A )  Patient Location: PACU  Anesthesia Type:General  Level of Consciousness: awake, alert  and oriented  Airway & Oxygen Therapy: Patient Spontanous Breathing and Patient connected to face mask oxygen  Post-op Assessment: Report given to RN and Post -op Vital signs reviewed and stable  Post vital signs: Reviewed and stable  Last Vitals:  Vitals Value Taken Time  BP 132/77 12/04/18 1054  Temp    Pulse 82 12/04/18 1056  Resp 22 12/04/18 1056  SpO2 100 % 12/04/18 1056  Vitals shown include unvalidated device data.  Last Pain:  Vitals:   12/04/18 1055  TempSrc:   PainSc: (P) Asleep         Complications: No apparent anesthesia complications

## 2018-12-04 NOTE — Anesthesia Procedure Notes (Signed)
Procedure Name: Intubation Date/Time: 12/04/2018 7:43 AM Performed by: Alain Marion, CRNA Pre-anesthesia Checklist: Patient identified, Emergency Drugs available, Suction available and Patient being monitored Patient Re-evaluated:Patient Re-evaluated prior to induction Oxygen Delivery Method: Circle System Utilized Preoxygenation: Pre-oxygenation with 100% oxygen Induction Type: IV induction Ventilation: Mask ventilation without difficulty Laryngoscope Size: Miller and 2 Grade View: Grade I Tube type: Oral Tube size: 7.0 mm Number of attempts: 1 Airway Equipment and Method: Stylet and Oral airway Placement Confirmation: ETT inserted through vocal cords under direct vision,  positive ETCO2 and breath sounds checked- equal and bilateral Secured at: 22 cm Tube secured with: Tape Dental Injury: Teeth and Oropharynx as per pre-operative assessment

## 2018-12-04 NOTE — Interval H&P Note (Signed)
History and Physical Interval Note:  12/04/2018 7:18 AM  Kaitlyn Rasmussen  has presented today for surgery, with the diagnosis of goiter.  The various methods of treatment have been discussed with the patient and family. After consideration of risks, benefits and other options for treatment, the patient has consented to  Procedure(s): TOTAL THYROIDECTOMY (N/A) possible, STERNOTOMY (N/A) as a surgical intervention.  The patient's history has been reviewed, patient examined, no change in status, stable for surgery.  I have reviewed the patient's chart and labs.  Questions were answered to the patient's satisfaction.    Discussed possible sternotomy if necessary but risk is less than 5 %  CT surgery available   Likelyhood  High  of hoarseness and temporary hypocalcemia    Marcello Moores A Filomeno Cromley

## 2018-12-04 NOTE — Op Note (Signed)
Preoperative diagnosis: Multinodular goiter  Postoperative diagnosis: Same  Procedure: Total thyroidectomy  Surgeon: Erroll Luna, MD  Assistant Dr. Armandina Gemma, MD  EBL: 100 cc  Drains: None  IV fluids: Per anesthesia record    Specimen: Multinodular thyroid gland to pathology stitch marks right superior lobe  Indications for procedure: The patient is a 43 year old female with a longstanding history of a large multinodular goiter.  It is causing globus sensation and significant pressure in her neck.  She was worked up with ultrasound and no suspicious nodules are identified with CT scanning showed a massively enlarged thyroid gland extending into the mediastinum.  She was seen by cardiothoracic surgery if the need for sternotomy necessary during procedure.  Risks and benefits were discussed.  Continued observation was an option but she opted to proceed with surgery.The procedure has been discussed with the patient.  Alternative therapies have been discussed with the patient.  Operative risks include bleeding,  Infection,  Organ injury,   recurrent laryngeal nerve injury, hypocalcemia blood vessel injury,  DVT,  Pulmonary embolism,  Death,  And possible reoperation.  Medical management risks include worsening of present situation.  The success of the procedure is 50 -90 % at treating patients symptoms.  The patient understands and agrees to proceed.  Description of procedure: The patient was met in the holding area and questions were answered.  She was taken back the operating.  She was placed supine upon the operating table.  After induction of general esthesia both arms were tucked and a roll was placed under her upper back.  Her neck was extended with appropriate padding and support.  The neck and upper chest regions were prepped and draped in sterile fashion timeout was performed.  She received appropriate preoperative antibiotics.  A transverse cervical incision was made dissection was  carried down to the platysma muscle which was divided then elevated in a superior and inferior fashion to create appropriate flaps for exposure.  Midline strap muscles were identified.  Her trachea had significant deviation to the right.  Midline strap muscles were then mobilized separated laterally.  This enabled [place] to get her finger around both lobes of the thyroid..  This was a massively enlarged gland and extended quite posterior in both directions.  The right side was mobilized first.  This was quite bulky.  I opted to divide the sternohyoid and omohyoid muscles for exposure on the right since we could not get out of exposure with the midline incision only.  This improved her visualization dramatically.  We then mobilized the right superior pole.  The right superior pole vessels were identified and dissected individually.  We control that with clips in the harmonic scalpel to mobilize the right superior pole.  The gland was then carefully rolled up.  The superior parathyroid gland on the right was identified and preserved.  We then rolled the gland more medial.  We identified the inferior thyroid vessels and these were controlled between clips and the harmonic scalpel.  The middle thyroid vein was controlled between clips and harmonic scalpel.  The right recurrent radial nerve was identified in the tracheoesophageal groove and preserved.  The right inferior parathyroid gland was visualized and preserved.  We then turned our attention to the left lobe.  Similar fashion the left superior pole vessels were identified dissected out carefully and controlled between small clips and harmonic scalpel.  This enabled [place] to roll the gland more medially medially.  We continued dissection inferiorly until identified the  middle thyroid vein and divided between clips and harmonic scalpel.  The inferior thyroid pole vessels were identified and divided individually between clips and harmonic scalpel.  The left superior  parathyroid gland was preserved and we did not see the left inferior gland.  We identified the left recurrent radial nerve and preserve this.  She had significant amount of thyroid tissue in the mediastinum.  I was able to carefully sweep my finger anterior to the gland posterior to the sternum.  We carefully used blunt dissection and mobilized up into the wound the loose attachments.  These were then taken down with the harmonic scalpel which helped [place] to further carefully mobilize up the substernal portion of recorder into the operative field and remove the gland in its entirety.  We had excellent hemostasis with minimal oozing from the nose mediastinum controlled with a small sponge.  The plan was oriented and sent to pathology.  We then reexamined the operative bed.  We reidentified both look recurrent laryngeal nerves and these were preserved.  The right superior and inferior parathyroids were intact and were viable as was the left superior parathyroid.  We could not identify the left inferior gland.  There is excellent hemostasis noted.  I removed the small pack to mediastinum and there is excellent control bleeding.  Surgicel fibrillar was used for the operative field after irrigation.  There is no active bleeding.  The right strap muscles were reapproximated.  We then reapproximated the midline strap muscles over the trachea.  Of note there is significant deviation of the trachea this improved dramatically with removal of thyroid gland.  We then closed the platysma muscle with 3-0 Vicryl as well as the strap muscles indicated above.  4 Monocryl was used to close the skin in a subcuticular fashion.  Dermabond was applied.  All final counts were found to be correct.  Patient was awoke extubated taken recovery in satisfactory condition.

## 2018-12-04 NOTE — Anesthesia Procedure Notes (Signed)
Arterial Line Insertion Start/End8/08/2018 7:50 AM Performed by: Oleta Mouse, MD, anesthesiologist  Preanesthetic checklist: patient identified, IV checked, site marked, risks and benefits discussed, surgical consent, monitors and equipment checked, pre-op evaluation, timeout performed and anesthesia consent Patient sedated Right, radial was placed Catheter size: 20 G Hand hygiene performed  and maximum sterile barriers used   Attempts: 1 Procedure performed without using ultrasound guided technique. Following insertion, dressing applied and Biopatch. Post procedure assessment: normal  Patient tolerated the procedure well with no immediate complications.

## 2018-12-05 ENCOUNTER — Encounter (HOSPITAL_COMMUNITY): Payer: Self-pay | Admitting: Surgery

## 2018-12-05 DIAGNOSIS — E042 Nontoxic multinodular goiter: Secondary | ICD-10-CM | POA: Diagnosis not present

## 2018-12-05 LAB — COMPREHENSIVE METABOLIC PANEL
ALT: 17 U/L (ref 0–44)
AST: 19 U/L (ref 15–41)
Albumin: 3.7 g/dL (ref 3.5–5.0)
Alkaline Phosphatase: 37 U/L — ABNORMAL LOW (ref 38–126)
Anion gap: 11 (ref 5–15)
BUN: 8 mg/dL (ref 6–20)
CO2: 23 mmol/L (ref 22–32)
Calcium: 8.5 mg/dL — ABNORMAL LOW (ref 8.9–10.3)
Chloride: 101 mmol/L (ref 98–111)
Creatinine, Ser: 0.63 mg/dL (ref 0.44–1.00)
GFR calc Af Amer: >60 mL/min (ref >60)
GFR calc non Af Amer: >60 mL/min (ref >60)
Glucose, Bld: 118 mg/dL — ABNORMAL HIGH (ref 70–99)
Potassium: 4.3 mmol/L (ref 3.5–5.1)
Sodium: 135 mmol/L (ref 135–145)
Total Bilirubin: 0.6 mg/dL (ref 0.3–1.2)
Total Protein: 6.6 g/dL (ref 6.5–8.1)

## 2018-12-05 LAB — CBC
HCT: 37.7 % (ref 36.0–46.0)
Hemoglobin: 11.9 g/dL — ABNORMAL LOW (ref 12.0–15.0)
MCH: 29.6 pg (ref 26.0–34.0)
MCHC: 31.6 g/dL (ref 30.0–36.0)
MCV: 93.8 fL (ref 80.0–100.0)
Platelets: 247 10*3/uL (ref 150–400)
RBC: 4.02 MIL/uL (ref 3.87–5.11)
RDW: 13.6 % (ref 11.5–15.5)
WBC: 9.3 10*3/uL (ref 4.0–10.5)
nRBC: 0 % (ref 0.0–0.2)

## 2018-12-05 LAB — PHOSPHORUS: Phosphorus: 4.5 mg/dL (ref 2.5–4.6)

## 2018-12-05 MED ORDER — METHOCARBAMOL 500 MG PO TABS: 500.0000 mg | ORAL_TABLET | Freq: Three times a day (TID) | ORAL | Status: DC | PRN

## 2018-12-05 MED ORDER — OXYCODONE HCL 5 MG PO TABS: 5.0000 mg | ORAL_TABLET | Freq: Four times a day (QID) | ORAL | 0 refills | Status: DC | PRN

## 2018-12-05 MED ORDER — IBUPROFEN 800 MG PO TABS: 800.0000 mg | ORAL_TABLET | Freq: Three times a day (TID) | ORAL | 0 refills | Status: AC | PRN

## 2018-12-05 MED ORDER — LEVOTHYROXINE SODIUM 100 MCG PO TABS: 100.0000 ug | ORAL_TABLET | Freq: Every day | ORAL | 3 refills | Status: AC

## 2018-12-05 MED ORDER — ONDANSETRON 4 MG PO TBDP: 4.0000 mg | ORAL_TABLET | Freq: Four times a day (QID) | ORAL | 0 refills | Status: DC | PRN

## 2018-12-05 MED ORDER — LEVOTHYROXINE SODIUM 100 MCG PO TABS
100.0000 ug | ORAL_TABLET | Freq: Every day | ORAL | Status: DC
  Administered 2018-12-05: 08:00:00 100 ug via ORAL
  Filled 2018-12-05: qty 1

## 2018-12-05 MED ORDER — CALCIUM CARBONATE ANTACID 500 MG PO CHEW
2.0000 | CHEWABLE_TABLET | Freq: Three times a day (TID) | ORAL | Status: DC
  Administered 2018-12-05: 11:00:00 400 mg via ORAL
  Filled 2018-12-05: qty 2

## 2018-12-05 MED ORDER — METHOCARBAMOL 500 MG PO TABS: 500.0000 mg | ORAL_TABLET | Freq: Three times a day (TID) | ORAL | 1 refills | Status: DC | PRN

## 2018-12-05 MED ORDER — CALCIUM CARBONATE ANTACID 500 MG PO CHEW: 2.0000 | CHEWABLE_TABLET | Freq: Three times a day (TID) | ORAL | 3 refills | Status: DC

## 2018-12-05 NOTE — Plan of Care (Signed)

## 2018-12-05 NOTE — Progress Notes (Signed)
1 Day Post-Op   Subjective/Chief Complaint: Patient is sore but doing okay.  Denies any paresthesia or spasms   Objective: Vital signs in last 24 hours: Temp:  [97 F (36.1 C)-99.1 F (37.3 C)] 99.1 F (37.3 C) (08/05 0411) Pulse Rate:  [75-102] 102 (08/05 0411) Resp:  [13-20] 17 (08/05 0411) BP: (127-160)/(77-90) 137/82 (08/05 0411) SpO2:  [98 %-100 %] 98 % (08/05 0411) Last BM Date: 12/03/18  Intake/Output from previous day: 08/04 0701 - 08/05 0700 In: 1809.4 [P.O.:75; I.V.:1634.4; IV Piggyback:100] Out: 50 [Blood:50] Intake/Output this shift: No intake/output data recorded.  General appearance: alert and cooperative Neck: Incision clean dry intact.  No hematoma.  No hoarseness.  Lab Results:  Recent Labs    12/05/18 0135  WBC 9.3  HGB 11.9*  HCT 37.7  PLT 247   BMET Recent Labs    12/04/18 1624 12/05/18 0135  NA 133* 135  K 4.3 4.3  CL 101 101  CO2 21* 23  GLUCOSE 165* 118*  BUN 14 8  CREATININE 0.74 0.63  CALCIUM 8.8* 8.5*   PT/INR No results for input(s): LABPROT, INR in the last 72 hours. ABG No results for input(s): PHART, HCO3 in the last 72 hours.  Invalid input(s): PCO2, PO2  Studies/Results: No results found.  Anti-infectives: Anti-infectives (From admission, onward)   Start     Dose/Rate Route Frequency Ordered Stop   12/04/18 1400  ceFAZolin (ANCEF) IVPB 2g/100 mL premix     2 g 200 mL/hr over 30 Minutes Intravenous Every 8 hours 12/04/18 1213 12/04/18 1444   12/04/18 0630  ceFAZolin (ANCEF) IVPB 2g/100 mL premix     2 g 200 mL/hr over 30 Minutes Intravenous To [facility] 12/03/18 1222 12/04/18 0750      Assessment/Plan: s/p Procedure(s): TOTAL THYROIDECTOMY (N/A) Discharge  tums  2 tablets p.o. 3 times daily Synthroid 100 mcg daily  Apply ice and out of work for 2 weeks  Return to clinic 3 weeks for calcium recheck  LOS: 0 days    Marcello Moores A Shayann Garbutt 12/05/2018

## 2018-12-05 NOTE — Discharge Instructions (Signed)
[facility], [place] [phone]  THYROID/ PARATHYROID SURGERY: POST OP INSTRUCTIONS  Always review your discharge instruction sheet given to you by the facility where your surgery was performed.  IF YOU HAVE DISABILITY OR FAMILY LEAVE FORMS, YOU MUST BRING THEM TO THE OFFICE FOR PROCESSING.  PLEASE DO NOT GIVE THEM TO YOUR DOCTOR.  1. A prescription for pain medication may be given to you upon discharge.  Take your pain medication as prescribed, if needed.  If narcotic pain medicine is not needed, then you may take acetaminophen (Tylenol) or ibuprofen (Advil) as needed. 2. Take your usually prescribed medications unless otherwise directed. 3. If you need a refill on your pain medication, please contact your pharmacy. They will contact our office to request authorization.  Prescriptions will not be filled after 5pm or on week-ends. 4. You should follow a light diet the first 24 hours after arrival home, such as soup and crackers, etc.  Be sure to include lots of fluids daily.  Resume your normal diet the day after surgery. 5. Most patients will experience some swelling and bruising on the chest and neck area.  Ice packs will help.  Swelling and bruising can take several days to resolve.  6. It is common to experience some constipation if taking pain medication after surgery.  Increasing fluid intake and taking a stool softener will usually help or prevent this problem from occurring.  A mild laxative (Milk of Magnesia or Miralax) should be taken according to package directions if there are no bowel movements after 48 hours. 7. Unless discharge instructions indicate otherwise, you may remove your bandages 24-48 hours after surgery, and you may shower at that time.  You may have steri-strips (small skin tapes) in place directly over the incision.  These strips should be left on the skin for 7-10 days.  If your surgeon used skin glue on the incision, you may shower in 24 hours.  The  glue will flake off over the next 2-3 weeks.  Any sutures or staples will be removed at the office during your follow-up visit. 8. ACTIVITIES:  You may resume regular (light) daily activities beginning the next day--such as daily self-care, walking, climbing stairs--gradually increasing activities as tolerated.  You may have sexual intercourse when it is comfortable.  Refrain from any heavy lifting or straining until approved by your doctor. a. You may drive when you no longer are taking prescription pain medication, you can comfortably wear a seatbelt, and you can safely maneuver your car and apply brakes b. RETURN TO WORK:  _______2 WEEKS ___________________________________________________ 9. You should see your doctor in the office for a follow-up appointment approximately two weeks after your surgery.  Make sure that you call for this appointment within a day or two after you arrive home to insure a convenient appointment time. 10. OTHER INSTRUCTIONS: _____folllow up 3 weeks in office  11. Will set up labs draw to check calcium _______________________________________________________________________ _________________________________________________________________________________________________________________ _________________________________________________________________________________________________________________   WHEN TO CALL YOUR DOCTOR: 1. Fever over 101.0 2. Inability to urinate 3. Nausea and/or vomiting 4. Extreme swelling or bruising 5. Continued bleeding from incision. 6. Increased pain, redness, or drainage from the incision. 7. Difficulty swallowing or breathing 8. Muscle cramping or spasms. 9. Numbness or tingling in hands or feet or around lips.  The clinic staff is available to answer your questions during regular business hours.  Please dont hesitate to call and ask to speak to one of the  nurses if you have concerns.  For further questions, please visit  [email or web address]

## 2018-12-05 NOTE — Discharge Summary (Signed)
Physician Discharge Summary  Patient ID: Kaitlyn Rasmussen MRN: [id number] DOB/AGE: 1975/08/08 43 y.o.  Admit date: 12/04/2018 Discharge date: 12/05/2018  Admission Diagnoses:   Goiter diffuse, nontoxic  Discharge Diagnoses:  Active Problems:   Goiter diffuse, nontoxic   Discharged Condition: good  Hospital Course: Patient admitted postoperatively after total thyroidectomy.  She did well postoperatively.  She had a strong voice.  She had no difficulty breathing.  She had no evidence of hematoma on postoperative day 1.  Her calcium level was 8.5 on postoperative day 1.  She denies any paresthesia or weakness or tingling.  She was able to tolerate liquids, ambulate and had good pain control.    Significant Diagnostic Studies: labs:  CMP Latest Ref Rng & Units 12/05/2018 12/04/2018 11/28/2018  Glucose 70 - 99 mg/dL 118(H) 165(H) 88  BUN 6 - 20 mg/dL 8 14 15   Creatinine 0.44 - 1.00 mg/dL 0.63 0.74 0.89  Sodium 135 - 145 mmol/L 135 133(L) 139  Potassium 3.5 - 5.1 mmol/L 4.3 4.3 4.6  Chloride 98 - 111 mmol/L 101 101 109  CO2 22 - 32 mmol/L 23 21(L) 24  Calcium 8.9 - 10.3 mg/dL 8.5(L) 8.8(L) 9.2  Total Protein 6.5 - 8.1 g/dL 6.6 7.1 7.0  Total Bilirubin 0.3 - 1.2 mg/dL 0.6 0.5 0.5  Alkaline Phos 38 - 126 U/L 37(L) 38 40  AST 15 - 41 U/L 19 24 17   ALT 0 - 44 U/L 17 20 16     Treatments: surgery: Total thyroidectomy  Discharge Exam: Blood pressure 137/82, pulse (!) 102, temperature 99.1 F (37.3 C), temperature source Oral, resp. rate 17, height 5\' 4"  (1.626 m), weight 83 kg, last menstrual period 11/22/2018, SpO2 98 %. General appearance: alert and cooperative Head: Normocephalic, without obvious abnormality, atraumatic Neck: Incision clean dry intact.  Mild swelling noted.  No hematoma.  No stridor.   Negative for paresthesia, tingling, or carpopedal spasm  Disposition: Discharge disposition: 01-Home or Self Care       Discharge Instructions    Diet - low sodium heart healthy    Complete by: As directed    Increase activity slowly   Complete by: As directed      Allergies as of 12/05/2018   No Known Allergies     Medication List    STOP taking these medications   acetaminophen 500 MG tablet Commonly known as: TYLENOL     TAKE these medications   azithromycin 250 MG tablet Commonly known as: ZITHROMAX Take 4 tablets (1,000 mg total) by mouth once.   calcium carbonate 500 MG chewable tablet Commonly known as: TUMS - dosed in mg elemental calcium Chew 2 tablets (400 mg of elemental calcium total) by mouth 3 (three) times daily.   ergocalciferol 1.25 MG (50000 UT) capsule Commonly known as: VITAMIN D2 Take 50,000 Units by mouth once a week.   EYE DROPS ADVANCED RELIEF OP Place 1 drop into both eyes every morning.   fluconazole 150 MG tablet Commonly known as: DIFLUCAN Take 1 tablet (150 mg total) by mouth once. Repeat if needed   ibuprofen 800 MG tablet Commonly known as: ADVIL Take 1 tablet (800 mg total) by mouth every 8 (eight) hours as needed.   levothyroxine 100 MCG tablet Commonly known as: SYNTHROID Take 1 tablet (100 mcg total) by mouth daily at 6 (six) AM.   methocarbamol 500 MG tablet Commonly known as: ROBAXIN Take 1 tablet (500 mg total) by mouth every 8 (eight) hours as needed for muscle  spasms.   ondansetron 4 MG disintegrating tablet Commonly known as: ZOFRAN-ODT Take 1 tablet (4 mg total) by mouth every 6 (six) hours as needed for nausea.   oxyCODONE 5 MG immediate release tablet Commonly known as: Oxy IR/ROXICODONE Take 1 tablet (5 mg total) by mouth every 6 (six) hours as needed for severe pain.        Signed: Joyice Faster Nilda Rasmussen 12/05/2018, 8:02 AM

## 2018-12-12 NOTE — Anesthesia Postprocedure Evaluation (Signed)
Anesthesia Post Note  Patient: Kaitlyn Rasmussen  Procedure(s) Performed: TOTAL THYROIDECTOMY (N/A )     Patient location during evaluation: PACU Anesthesia Type: General Level of consciousness: awake and alert Pain management: pain level controlled Vital Signs Assessment: post-procedure vital signs reviewed and stable Respiratory status: spontaneous breathing, nonlabored ventilation, respiratory function stable and patient connected to nasal cannula oxygen Cardiovascular status: blood pressure returned to baseline and stable Postop Assessment: no apparent nausea or vomiting Anesthetic complications: no    Last Vitals:  Vitals:   12/05/18 0411 12/05/18 1055  BP: 137/82 124/73  Pulse: (!) 102 99  Resp: 17 16  Temp: 37.3 C 37.5 C  SpO2: 98% 98%    Last Pain:  Vitals:   12/05/18 1055  TempSrc: Oral  PainSc:                  Ryenn Howeth

## 2019-11-07 ENCOUNTER — Other Ambulatory Visit (HOSPITAL_COMMUNITY): Payer: Self-pay | Admitting: Urology

## 2019-11-07 DIAGNOSIS — C229 Malignant neoplasm of liver, not specified as primary or secondary: Secondary | ICD-10-CM

## 2019-11-21 ENCOUNTER — Encounter (HOSPITAL_COMMUNITY): Payer: Self-pay

## 2019-11-21 ENCOUNTER — Ambulatory Visit (HOSPITAL_COMMUNITY): Payer: BC Managed Care – PPO

## 2020-07-02 ENCOUNTER — Other Ambulatory Visit: Payer: Self-pay | Admitting: Internal Medicine

## 2020-07-03 LAB — COMPLETE METABOLIC PANEL WITH GFR
AG Ratio: 1.5 (calc) (ref 1.0–2.5)
ALT: 6 U/L (ref 6–29)
AST: 11 U/L (ref 10–30)
Albumin: 4.3 g/dL (ref 3.6–5.1)
Alkaline phosphatase (APISO): 42 U/L (ref 31–125)
BUN: 21 mg/dL (ref 7–25)
CO2: 24 mmol/L (ref 20–32)
Calcium: 9 mg/dL (ref 8.6–10.2)
Chloride: 107 mmol/L (ref 98–110)
Creat: 0.77 mg/dL (ref 0.50–1.10)
GFR, Est African American: 110 mL/min/{1.73_m2} (ref >=60)
GFR, Est Non African American: 95 mL/min/{1.73_m2} (ref >=60)
Globulin: 2.8 g/dL (calc) (ref 1.9–3.7)
Glucose, Bld: 94 mg/dL (ref 65–99)
Potassium: 3.7 mmol/L (ref 3.5–5.3)
Sodium: 140 mmol/L (ref 135–146)
Total Bilirubin: 0.6 mg/dL (ref 0.2–1.2)
Total Protein: 7.1 g/dL (ref 6.1–8.1)

## 2020-07-03 LAB — CBC
HCT: 37.1 % (ref 35.0–45.0)
Hemoglobin: 12.4 g/dL (ref 11.7–15.5)
MCH: 29.4 pg (ref 27.0–33.0)
MCHC: 33.4 g/dL (ref 32.0–36.0)
MCV: 87.9 fL (ref 80.0–100.0)
MPV: 11.4 fL (ref 7.5–12.5)
Platelets: 249 10*3/uL (ref 140–400)
RBC: 4.22 10*6/uL (ref 3.80–5.10)
RDW: 13.4 % (ref 11.0–15.0)
WBC: 4.7 10*3/uL (ref 3.8–10.8)

## 2020-07-03 LAB — LIPID PANEL
Cholesterol: 183 mg/dL (ref <200)
HDL: 69 mg/dL (ref >=50)
LDL Cholesterol (Calc): 92 mg/dL (calc)
Non-HDL Cholesterol (Calc): 114 mg/dL (calc) (ref <130)
Total CHOL/HDL Ratio: 2.7 (calc) (ref <5.0)
Triglycerides: 127 mg/dL (ref <150)

## 2020-07-03 LAB — VITAMIN D 25 HYDROXY (VIT D DEFICIENCY, FRACTURES): Vit D, 25-Hydroxy: 56 ng/mL (ref 30–100)

## 2020-07-03 LAB — TSH: TSH: 0.03 mIU/L — ABNORMAL LOW

## 2020-07-03 LAB — T4, FREE: Free T4: 1.5 ng/dL (ref 0.8–1.8)

## 2020-08-20 ENCOUNTER — Other Ambulatory Visit: Payer: Self-pay | Admitting: Internal Medicine

## 2020-08-23 LAB — URINE CULTURE
MICRO NUMBER:: 11798191
SPECIMEN QUALITY:: ADEQUATE

## 2020-10-06 ENCOUNTER — Encounter: Payer: Self-pay | Admitting: Advanced Practice Midwife

## 2020-10-06 ENCOUNTER — Other Ambulatory Visit: Payer: Self-pay

## 2020-10-06 ENCOUNTER — Other Ambulatory Visit (HOSPITAL_COMMUNITY)
Admission: RE | Admit: 2020-10-06 | Discharge: 2020-10-06 | Disposition: A | Discharge status: Home / Self Care | Payer: BC Managed Care – PPO | Source: Ambulatory Visit | Attending: Advanced Practice Midwife | Admitting: Advanced Practice Midwife

## 2020-10-06 ENCOUNTER — Ambulatory Visit (INDEPENDENT_AMBULATORY_CARE_PROVIDER_SITE_OTHER): Payer: BC Managed Care – PPO | Admitting: Advanced Practice Midwife

## 2020-10-06 VITALS — BP 144/96 | HR 71 | Ht 64.0 in | Wt 162.0 lb

## 2020-10-06 DIAGNOSIS — Z01419 Encounter for gynecological examination (general) (routine) without abnormal findings: Secondary | ICD-10-CM | POA: Diagnosis not present

## 2020-10-06 DIAGNOSIS — R300 Dysuria: Secondary | ICD-10-CM

## 2020-10-06 DIAGNOSIS — Z124 Encounter for screening for malignant neoplasm of cervix: Secondary | ICD-10-CM | POA: Diagnosis present

## 2020-10-06 DIAGNOSIS — N898 Other specified noninflammatory disorders of vagina: Secondary | ICD-10-CM

## 2020-10-06 DIAGNOSIS — R030 Elevated blood-pressure reading, without diagnosis of hypertension: Secondary | ICD-10-CM | POA: Diagnosis not present

## 2020-10-06 NOTE — Progress Notes (Signed)
Reports recent UTI, continued urinary urgency. Reports possible vaginal symptoms.

## 2020-10-06 NOTE — Progress Notes (Signed)
Subjective:     Kaitlyn Rasmussen is a 45 y.o. female here at [facility] for a routine exam.  Current complaints: some increase in vaginal discharge, slight odor, has hx bacterial vaginosis.  Personal health questionnaire reviewed: yes.  Do you have a primary care provider? yes Do you feel safe at home? yes    Health Maintenance Due  Topic Date Due  . Pneumococcal Vaccine 39-86 Years old (1 of 4 - PCV13) Never done  . COVID-19 Vaccine (1) Never done  . TETANUS/TDAP  05/02/2010  . PAP SMEAR-Modifier  12/25/2010     Risk factors for chronic health problems: Smoking: 0.25 pack/day Alchohol/how much: yes occasional Pt BMI: Body mass index is 27.81 kg/m.   Gynecologic History No LMP recorded. Contraception: tubal ligation Last Pap: 2012. Results were: normal Last mammogram: 2020. Results were: normal  Obstetric History OB History  Gravida Para Term Preterm AB Living  3 3 3     3   SAB IAB Ectopic Multiple Live Births          3    # Outcome Date GA Lbr Len/2nd Weight Sex Delivery Anes PTL Lv  3 Term         LIV  2 Term         LIV  1 Term         LIV     The following portions of the patient's history were reviewed and updated as appropriate: allergies, current medications, past family history, past medical history, past social history, past surgical history and problem list.  Review of Systems Pertinent items noted in HPI and remainder of comprehensive ROS otherwise negative.    Objective:   BP (!) 144/96   Pulse 71   Ht 5\' 4"  (1.626 m)   Wt 162 lb (73.5 kg)   BMI 27.81 kg/m  VS reviewed, nursing note reviewed,  Constitutional: well developed, well nourished, no distress HEENT: normocephalic CV: normal rate Pulm/chest wall: normal effort Breast Exam:  performed: right breast normal without mass, skin or nipple changes or axillary nodes, left breast normal without mass, skin or nipple changes or axillary nodes Abdomen: soft Neuro: alert and oriented x 3 Skin:  warm, dry Psych: affect normal Pelvic exam: Performed: Cervix pink, visually closed, without lesion, scant white creamy discharge, vaginal walls and external genitalia normal Bimanual exam: Cervix 0/long/high, firm, anterior, neg CMT, uterus nontender, nonenlarged, adnexa without tenderness, enlargement, or mass       Assessment/Plan:  1. Elevated blood pressure reading without diagnosis of hypertension --Discussed with pt, no hx HTN.  Discussed lifestyle changes to improve BP including healthy low sodium diet and exercise.   --Pt quit smoking 3 years ago so chart updated and pt commended. --Pt to f/u with PCP  2. Well woman exam with routine gynecological exam --No gyn concerns except occasional discharge and hx BV --No breast problems, did not do mammogram last year, some issues with insurance.  Mammogram ordered at [facility]. - MM DIGITAL SCREENING BILATERAL; Future  3. Vaginal discharge --Pt reports slight odor, discharge increased after changing soaps so she is concerned about BV - Cervicovaginal ancillary only( Kaitlyn Rasmussen)  4. Screening for cervical cancer  - Cytology - PAP( Kaitlyn Rasmussen)  5. Dysuria --Some mild irritation, unsure if it is vaginal or urinary.   - Urinalysis, Routine w reflex microscopic    Follow up in: 1 year or as needed.   Kaitlyn Rasmussen, CNM 9:19 AM

## 2020-10-07 LAB — CERVICOVAGINAL ANCILLARY ONLY
Bacterial Vaginitis (gardnerella): NEGATIVE
Candida Glabrata: NEGATIVE
Candida Vaginitis: NEGATIVE
Chlamydia: NEGATIVE
Comment: NEGATIVE
Comment: NEGATIVE
Comment: NEGATIVE
Comment: NEGATIVE
Comment: NEGATIVE
Comment: NORMAL
Neisseria Gonorrhea: NEGATIVE
Trichomonas: NEGATIVE

## 2020-10-07 LAB — URINALYSIS, ROUTINE W REFLEX MICROSCOPIC
Bilirubin, UA: NEGATIVE
Glucose, UA: NEGATIVE
Ketones, UA: NEGATIVE
Leukocytes,UA: NEGATIVE
Nitrite, UA: NEGATIVE
Protein,UA: NEGATIVE
RBC, UA: NEGATIVE
Specific Gravity, UA: 1.02 (ref 1.005–1.030)
Urobilinogen, Ur: 0.2 mg/dL (ref 0.2–1.0)
pH, UA: 7.5 (ref 5.0–7.5)

## 2020-10-09 LAB — CYTOLOGY - PAP
Adequacy: ABSENT
Comment: NEGATIVE
Diagnosis: NEGATIVE
High risk HPV: NEGATIVE

## 2020-10-13 ENCOUNTER — Encounter (HOSPITAL_BASED_OUTPATIENT_CLINIC_OR_DEPARTMENT_OTHER): Payer: Self-pay | Admitting: Radiology

## 2020-10-13 ENCOUNTER — Other Ambulatory Visit: Payer: Self-pay

## 2020-10-13 ENCOUNTER — Ambulatory Visit (HOSPITAL_BASED_OUTPATIENT_CLINIC_OR_DEPARTMENT_OTHER)
Admission: RE | Admit: 2020-10-13 | Discharge: 2020-10-13 | Disposition: A | Discharge status: Home / Self Care | Payer: BC Managed Care – PPO | Source: Ambulatory Visit | Attending: Advanced Practice Midwife | Admitting: Advanced Practice Midwife

## 2020-10-13 DIAGNOSIS — Z1231 Encounter for screening mammogram for malignant neoplasm of breast: Secondary | ICD-10-CM | POA: Insufficient documentation

## 2020-10-13 DIAGNOSIS — Z01419 Encounter for gynecological examination (general) (routine) without abnormal findings: Secondary | ICD-10-CM

## 2020-10-20 ENCOUNTER — Other Ambulatory Visit: Payer: Self-pay | Admitting: Internal Medicine

## 2020-10-23 LAB — INFLUENZA A AND B RNA,QUALITATIVE, REAL-TIME RT-PCR
INFLUENZA B RNA,PCR: NOT DETECTED
Influenza A: NOT DETECTED

## 2020-10-23 LAB — SARS-COV-2 RNA,(COVID-19) QUALITATIVE NAAT: SARS CoV2 RNA: NOT DETECTED

## 2020-12-25 ENCOUNTER — Other Ambulatory Visit: Payer: Self-pay | Admitting: Internal Medicine

## 2020-12-26 LAB — URINE CULTURE
MICRO NUMBER:: 12297036
Result:: NO GROWTH
SPECIMEN QUALITY:: ADEQUATE

## 2021-05-13 ENCOUNTER — Other Ambulatory Visit: Payer: Self-pay | Admitting: Internal Medicine

## 2021-05-16 LAB — COMPLETE METABOLIC PANEL WITH GFR
AG Ratio: 1.6 (calc) (ref 1.0–2.5)
ALT: 7 U/L (ref 6–29)
AST: 12 U/L (ref 10–30)
Albumin: 4.5 g/dL (ref 3.6–5.1)
Alkaline phosphatase (APISO): 38 U/L (ref 31–125)
BUN: 19 mg/dL (ref 7–25)
CO2: 26 mmol/L (ref 20–32)
Calcium: 9.4 mg/dL (ref 8.6–10.2)
Chloride: 105 mmol/L (ref 98–110)
Creat: 0.89 mg/dL (ref 0.50–0.99)
Globulin: 2.9 g/dL (calc) (ref 1.9–3.7)
Glucose, Bld: 76 mg/dL (ref 65–99)
Potassium: 4.3 mmol/L (ref 3.5–5.3)
Sodium: 139 mmol/L (ref 135–146)
Total Bilirubin: 0.6 mg/dL (ref 0.2–1.2)
Total Protein: 7.4 g/dL (ref 6.1–8.1)
eGFR: 82 mL/min/{1.73_m2} (ref >=60)

## 2021-05-16 LAB — CBC
HCT: 37.5 % (ref 35.0–45.0)
Hemoglobin: 12.1 g/dL (ref 11.7–15.5)
MCH: 28.9 pg (ref 27.0–33.0)
MCHC: 32.3 g/dL (ref 32.0–36.0)
MCV: 89.7 fL (ref 80.0–100.0)
MPV: 10.4 fL (ref 7.5–12.5)
Platelets: 288 10*3/uL (ref 140–400)
RBC: 4.18 10*6/uL (ref 3.80–5.10)
RDW: 13.1 % (ref 11.0–15.0)
WBC: 4.6 10*3/uL (ref 3.8–10.8)

## 2021-05-16 LAB — URINE CULTURE
MICRO NUMBER:: 12863154
SPECIMEN QUALITY:: ADEQUATE

## 2021-05-16 LAB — VITAMIN D 25 HYDROXY (VIT D DEFICIENCY, FRACTURES): Vit D, 25-Hydroxy: 73 ng/mL (ref 30–100)

## 2021-05-16 LAB — CHLAMYDIA PROBE AMP THINPREP: C. trachomatis RNA, TMA: NOT DETECTED

## 2021-05-16 LAB — LIPID PANEL
Cholesterol: 225 mg/dL — ABNORMAL HIGH (ref <200)
HDL: 81 mg/dL (ref >=50)
LDL Cholesterol (Calc): 129 mg/dL (calc) — ABNORMAL HIGH
Non-HDL Cholesterol (Calc): 144 mg/dL (calc) — ABNORMAL HIGH (ref <130)
Total CHOL/HDL Ratio: 2.8 (calc) (ref <5.0)
Triglycerides: 59 mg/dL (ref <150)

## 2021-05-16 LAB — TSH: TSH: 0.88 mIU/L

## 2021-05-16 LAB — T4, FREE: Free T4: 1.4 ng/dL (ref 0.8–1.8)

## 2021-09-14 ENCOUNTER — Other Ambulatory Visit: Payer: Self-pay | Admitting: Internal Medicine

## 2021-09-17 LAB — URINE CULTURE
MICRO NUMBER:: 13402561
SPECIMEN QUALITY:: ADEQUATE

## 2021-11-26 ENCOUNTER — Ambulatory Visit (HOSPITAL_BASED_OUTPATIENT_CLINIC_OR_DEPARTMENT_OTHER)
Admission: RE | Admit: 2021-11-26 | Discharge: 2021-11-26 | Disposition: A | Discharge status: Home / Self Care | Payer: BC Managed Care – PPO | Source: Ambulatory Visit | Attending: Internal Medicine | Admitting: Internal Medicine

## 2021-11-26 ENCOUNTER — Other Ambulatory Visit (HOSPITAL_BASED_OUTPATIENT_CLINIC_OR_DEPARTMENT_OTHER): Payer: Self-pay | Admitting: Internal Medicine

## 2021-11-26 DIAGNOSIS — Z1231 Encounter for screening mammogram for malignant neoplasm of breast: Secondary | ICD-10-CM

## 2021-11-30 ENCOUNTER — Other Ambulatory Visit: Payer: Self-pay | Admitting: Internal Medicine

## 2021-11-30 DIAGNOSIS — R928 Other abnormal and inconclusive findings on diagnostic imaging of breast: Secondary | ICD-10-CM

## 2021-12-04 ENCOUNTER — Other Ambulatory Visit: Payer: Self-pay | Admitting: Internal Medicine

## 2021-12-04 ENCOUNTER — Ambulatory Visit
Admission: RE | Admit: 2021-12-04 | Discharge: 2021-12-04 | Disposition: A | Discharge status: Home / Self Care | Payer: BC Managed Care – PPO | Source: Ambulatory Visit | Attending: Internal Medicine | Admitting: Internal Medicine

## 2021-12-04 DIAGNOSIS — R928 Other abnormal and inconclusive findings on diagnostic imaging of breast: Secondary | ICD-10-CM

## 2021-12-04 DIAGNOSIS — N6310 Unspecified lump in the right breast, unspecified quadrant: Secondary | ICD-10-CM

## 2021-12-16 ENCOUNTER — Other Ambulatory Visit: Payer: Self-pay | Admitting: Internal Medicine

## 2021-12-17 LAB — T4, FREE: Free T4: 1.1 ng/dL (ref 0.8–1.8)

## 2021-12-17 LAB — EXTRA LAV TOP TUBE

## 2021-12-17 LAB — TSH: TSH: 0.5 mIU/L

## 2022-06-07 ENCOUNTER — Other Ambulatory Visit: Payer: Self-pay | Admitting: Internal Medicine

## 2022-06-07 ENCOUNTER — Ambulatory Visit
Admission: RE | Admit: 2022-06-07 | Discharge: 2022-06-07 | Disposition: A | Discharge status: Home / Self Care | Payer: BC Managed Care – PPO | Source: Ambulatory Visit | Attending: Internal Medicine | Admitting: Internal Medicine

## 2022-06-07 ENCOUNTER — Other Ambulatory Visit: Payer: BC Managed Care – PPO

## 2022-06-07 DIAGNOSIS — N6310 Unspecified lump in the right breast, unspecified quadrant: Secondary | ICD-10-CM

## 2022-11-07 ENCOUNTER — Other Ambulatory Visit: Payer: BC Managed Care – PPO

## 2022-11-07 IMAGING — MG MM DIGITAL SCREENING BILAT W/ TOMO AND CAD
8 series · 8 of 24 positions shown · non-contrast
Comparison: Previous exam(s).

CLINICAL DATA: Screening.

EXAM:
DIGITAL SCREENING BILATERAL MAMMOGRAM WITH TOMOSYNTHESIS AND CAD
TECHNIQUE: Bilateral screening digital craniocaudal and mediolateral oblique
mammograms were obtained. Bilateral screening digital breast
tomosynthesis was performed. The images were evaluated with
computer-aided detection.

[R CC synth-2D]
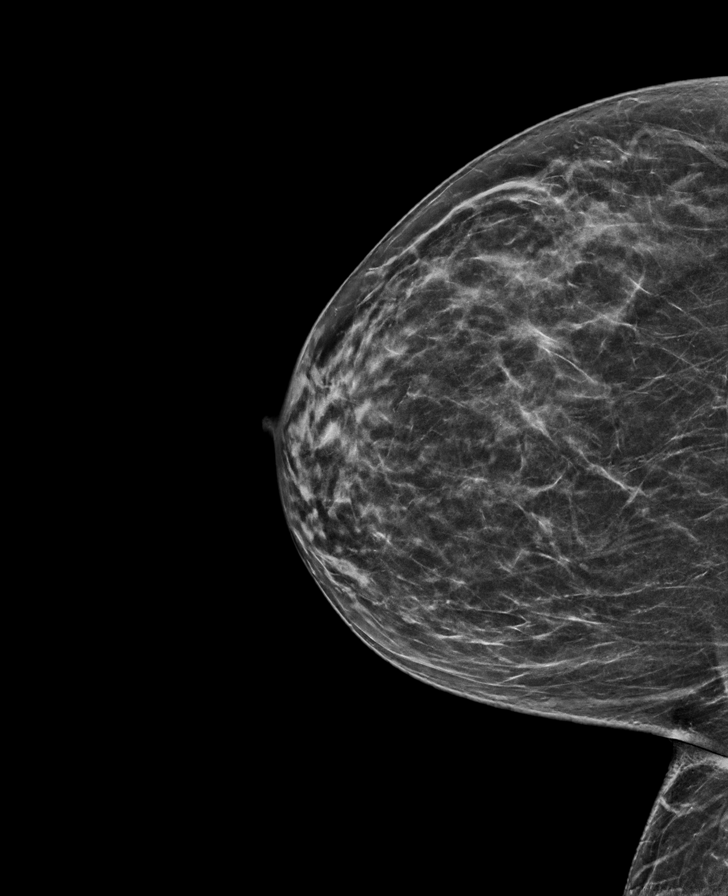

[L CC synth-2D]
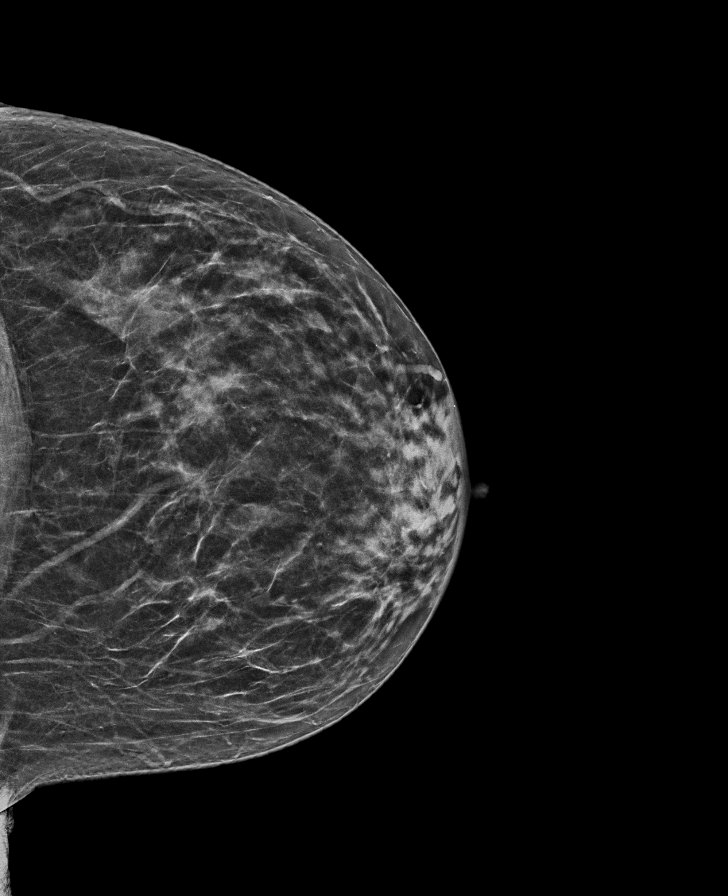

[L MLO synth-2D]
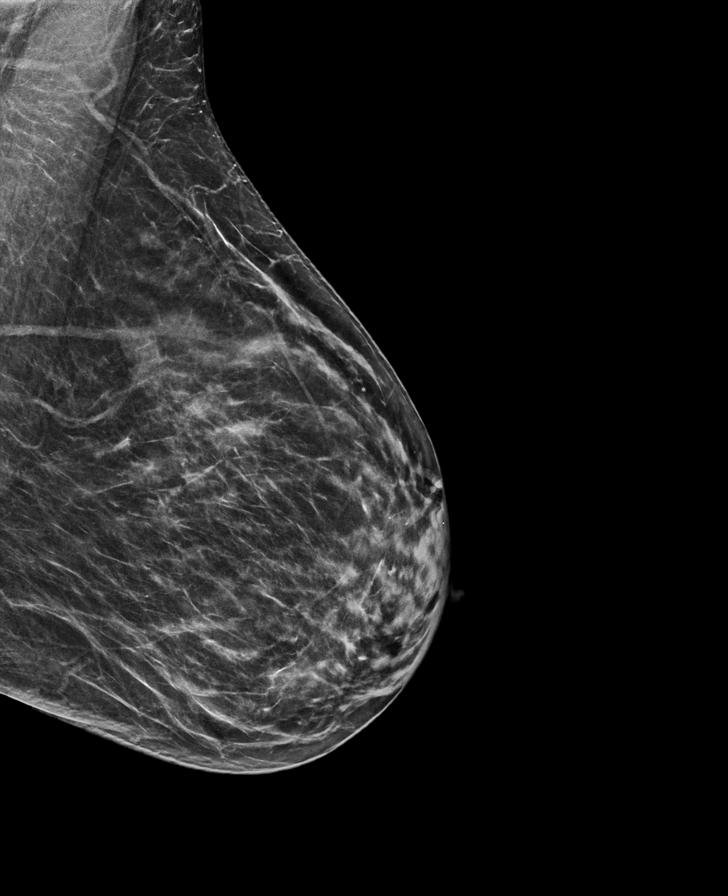

[R MLO synth-2D]
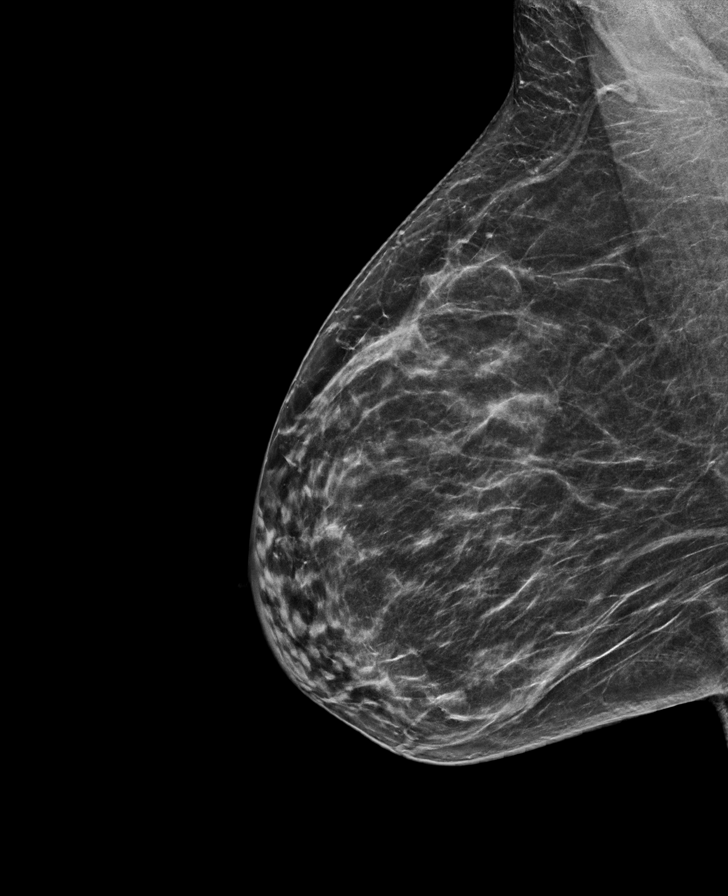

[R CC tomo · tomo slice 31/62.0]
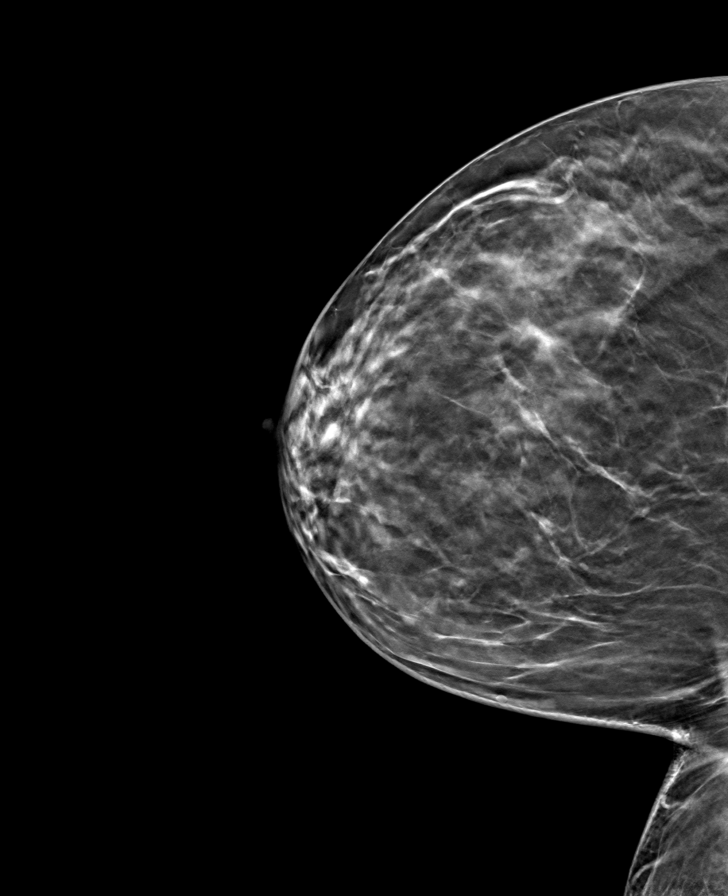

[L CC tomo · tomo slice 29/58.0]
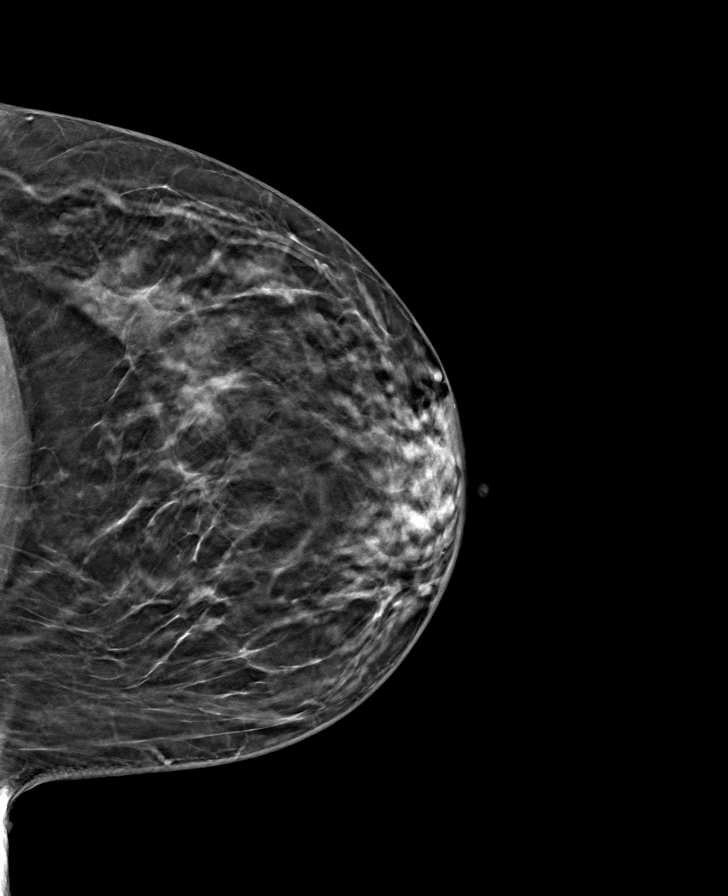

[L MLO tomo · tomo slice 32/63.0]
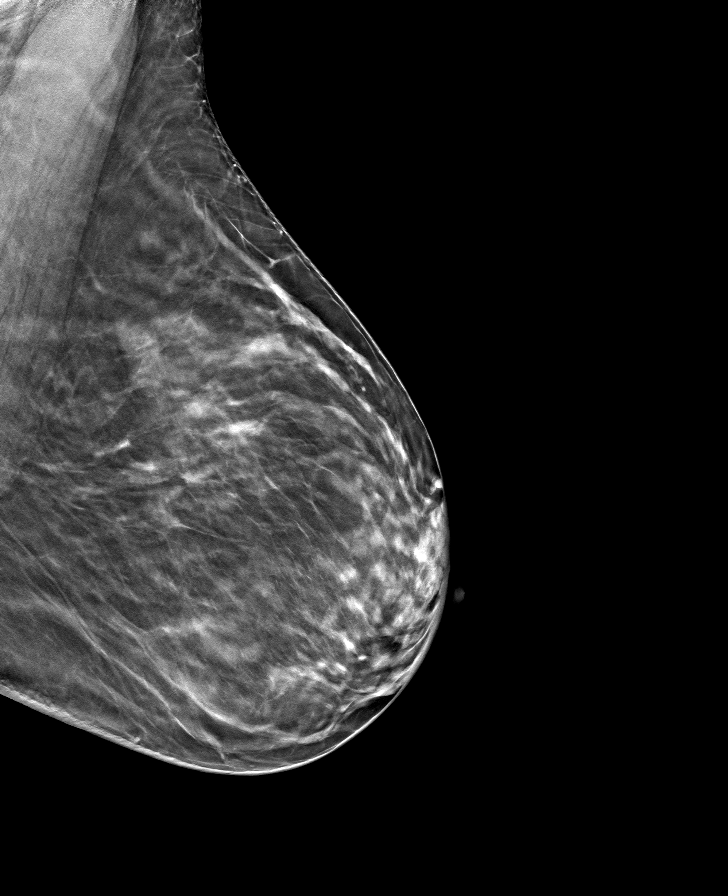

[R MLO tomo · tomo slice 33/64.0]
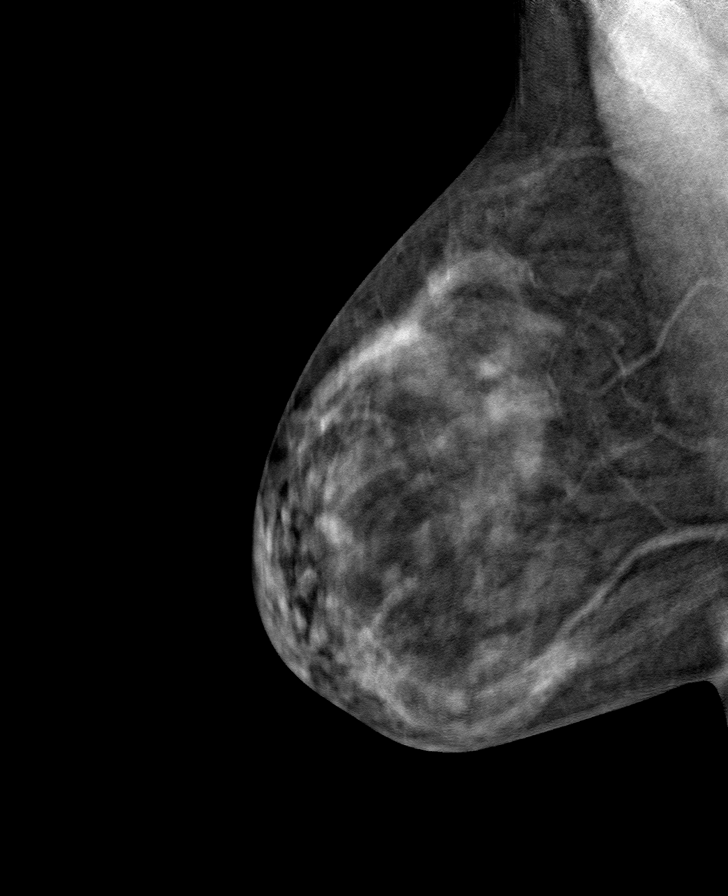

[8 of 24 positions shown; findings below may reference images not displayed]

ACR Breast Density Category c: The breast tissue is heterogeneously
dense, which may obscure small masses.
FINDINGS: There are no findings suspicious for malignancy.
IMPRESSION: No mammographic evidence of malignancy. A result letter of this
screening mammogram will be mailed directly to the patient.

RECOMMENDATION:
Screening mammogram in one year. (Code:Q3-W-BC3)

BI-RADS CATEGORY  1: Negative.

## 2022-11-16 ENCOUNTER — Encounter (HOSPITAL_BASED_OUTPATIENT_CLINIC_OR_DEPARTMENT_OTHER): Payer: Self-pay | Admitting: Emergency Medicine

## 2022-11-16 ENCOUNTER — Inpatient Hospital Stay
Admission: RE | Admit: 2022-11-16 | Discharge: 2022-11-16 | Disposition: A | Discharge status: Home / Self Care | Payer: 59 | Source: Ambulatory Visit | Attending: Internal Medicine | Admitting: Internal Medicine

## 2022-11-16 ENCOUNTER — Emergency Department (HOSPITAL_BASED_OUTPATIENT_CLINIC_OR_DEPARTMENT_OTHER)
Admission: EM | Admit: 2022-11-16 | Discharge: 2022-11-16 | Disposition: A | Discharge status: Home / Self Care | Payer: BC Managed Care – PPO | Attending: Emergency Medicine | Admitting: Emergency Medicine

## 2022-11-16 DIAGNOSIS — R519 Headache, unspecified: Secondary | ICD-10-CM | POA: Insufficient documentation

## 2022-11-16 DIAGNOSIS — R0602 Shortness of breath: Secondary | ICD-10-CM | POA: Insufficient documentation

## 2022-11-16 DIAGNOSIS — R030 Elevated blood-pressure reading, without diagnosis of hypertension: Secondary | ICD-10-CM | POA: Diagnosis not present

## 2022-11-16 MED ORDER — ALBUTEROL SULFATE HFA 108 (90 BASE) MCG/ACT IN AERS: 1.0000 | INHALATION_SPRAY | Freq: Four times a day (QID) | RESPIRATORY_TRACT | 0 refills | Status: AC | PRN

## 2022-11-16 NOTE — Discharge Instructions (Addendum)
You were seen in the ED today with trouble breathing.  We discussed your possible issues with mold in your apartment.  I feel it would be best if you were away from this possible cause of your symptoms.  I have called in an inhaler.  Please continue to follow with your primary care doctor.  Your blood pressure is elevated here and your primary care doctor should recheck this in the office and consider if additional blood pressure medications would be helpful.

## 2022-11-16 NOTE — ED Triage Notes (Signed)
Pt reports she has been having intermittent SHOB and HAs since 2021 when her upstairs neighbor's toilet overran and caused a leak in her apartment; NAD noted
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
## 2022-11-28 ENCOUNTER — Other Ambulatory Visit: Payer: BC Managed Care – PPO

## 2022-12-13 ENCOUNTER — Other Ambulatory Visit: Payer: Self-pay | Admitting: Internal Medicine

## 2023-08-28 ENCOUNTER — Ambulatory Visit (INDEPENDENT_AMBULATORY_CARE_PROVIDER_SITE_OTHER): Payer: Self-pay | Admitting: Obstetrics & Gynecology

## 2023-08-28 ENCOUNTER — Encounter: Payer: Self-pay | Admitting: Obstetrics & Gynecology

## 2023-08-28 ENCOUNTER — Other Ambulatory Visit (HOSPITAL_COMMUNITY)
Admission: RE | Admit: 2023-08-28 | Discharge: 2023-08-28 | Disposition: A | Discharge status: Home / Self Care | Source: Ambulatory Visit | Attending: Obstetrics & Gynecology | Admitting: Obstetrics & Gynecology

## 2023-08-28 VITALS — BP 163/109 | HR 87 | Ht 64.0 in | Wt 157.0 lb

## 2023-08-28 DIAGNOSIS — Z1151 Encounter for screening for human papillomavirus (HPV): Secondary | ICD-10-CM | POA: Diagnosis not present

## 2023-08-28 DIAGNOSIS — Z113 Encounter for screening for infections with a predominantly sexual mode of transmission: Secondary | ICD-10-CM | POA: Diagnosis not present

## 2023-08-28 DIAGNOSIS — Z124 Encounter for screening for malignant neoplasm of cervix: Secondary | ICD-10-CM | POA: Diagnosis not present

## 2023-08-28 DIAGNOSIS — Z01419 Encounter for gynecological examination (general) (routine) without abnormal findings: Secondary | ICD-10-CM | POA: Diagnosis present

## 2023-08-28 DIAGNOSIS — N951 Menopausal and female climacteric states: Secondary | ICD-10-CM | POA: Diagnosis not present

## 2023-08-28 LAB — POCT URINALYSIS DIPSTICK
Bilirubin, UA: NEGATIVE
Blood, UA: NEGATIVE
Glucose, UA: NEGATIVE
Ketones, UA: NEGATIVE
Leukocytes, UA: NEGATIVE
Nitrite, UA: NEGATIVE
Protein, UA: NEGATIVE
Spec Grav, UA: 1.015 (ref 1.010–1.025)
Urobilinogen, UA: 0.2 U/dL
pH, UA: 6.5 (ref 5.0–8.0)

## 2023-08-28 MED ORDER — NORETHIN-ETH ESTRAD-FE BIPHAS 1 MG-10 MCG / 10 MCG PO TABS: 1.0000 | ORAL_TABLET | Freq: Every day | ORAL | 11 refills | Status: DC

## 2023-08-28 NOTE — Progress Notes (Signed)
 Pt states she may have yeast infection, would like check today. Pt also request urine check - udip negative for all components. Pt complains of some perimenopausal symptoms.

## 2023-08-28 NOTE — Progress Notes (Signed)
 GYNECOLOGY CLINIC ANNUAL PREVENTATIVE CARE ENCOUNTER NOTE  Subjective:   Kaitlyn Rasmussen is a 48 y.o. [id number] female here for a routine annual gynecologic exam.  Current complaints: Vaginal Itching and Perimenopausal Symptoms. Denies abnormal vaginal bleeding, discharge, pelvic pain, problems with intercourse or other gynecologic concerns.   Patient reports new-onset sleep disturbances over the past two weeks. She reports that she moved in with her aunt earlier this year in addition to still being in the process of dealing with the death of her youngest son who passed away a few years ago. She reports preceding symptoms symptoms suggestive of perimenopause, including night sweats and hot flashes, though she does not recall the exact onset of these symptoms. Additionally, she recently completed a course of oral antibiotics for a finger infection and is now experiencing vaginal itching.    Gynecologic History Patient's last menstrual period was 08/21/2023. Contraception: tubal ligation Last Pap: 10/06/2020. Results were: normal Last mammogram: 11/09/2039. Results were: normal  Obstetric History OB History  Gravida Para Term Preterm AB Living  3 3 3   3   SAB IAB Ectopic Multiple Live Births      3    # Outcome Date GA Lbr Len/2nd Weight Sex Type Anes PTL Lv  3 Term         LIV  2 Term         LIV  1 Term         LIV    Past Medical History:  Diagnosis Date   Allergy    BV (bacterial vaginosis)    Chlamydia trachomatis infection in pregnancy    Depression    Goiter    Trichomonal cervicitis    UTI (lower urinary tract infection)     Past Surgical History:  Procedure Laterality Date   THYROIDECTOMY N/A 12/04/2018   Procedure: TOTAL THYROIDECTOMY;  Surgeon: Sim Dryer, MD;  Location: [facility] OR;  Service: General;  Laterality: N/A;   TUBAL LIGATION  02/02/2001    Current Outpatient Medications on File Prior to Visit  Medication Sig Dispense Refill   cholecalciferol (VITAMIN D3)  25 MCG (1000 UNIT) tablet Take 1,000 Units by mouth daily.     levothyroxine  (SYNTHROID ) 100 MCG tablet Take 1 tablet (100 mcg total) by mouth daily at 6 (six) AM. 30 tablet 3   albuterol  (VENTOLIN  HFA) 108 (90 Base) MCG/ACT inhaler Inhale 1-2 puffs into the lungs every 6 (six) hours as needed for wheezing or shortness of breath. 6.7 g 0   ergocalciferol  (VITAMIN D2) 1.25 MG (50000 UT) capsule Take 50,000 Units by mouth once a week.     ibuprofen  (ADVIL ) 800 MG tablet Take 1 tablet (800 mg total) by mouth every 8 (eight) hours as needed. 30 tablet 0   methocarbamol  (ROBAXIN ) 500 MG tablet Take 1 tablet (500 mg total) by mouth every 8 (eight) hours as needed for muscle spasms. (Patient not taking: Reported on 10/06/2020) 15 tablet 1   ondansetron  (ZOFRAN -ODT) 4 MG disintegrating tablet Take 1 tablet (4 mg total) by mouth every 6 (six) hours as needed for nausea. (Patient not taking: Reported on 10/06/2020) 20 tablet 0   Probiotic Product (RESTORA PO) Take by mouth daily.     Tetrahydroz-Dextran-PEG-Povid (EYE DROPS ADVANCED RELIEF OP) Place 1 drop into both eyes every morning. (Patient not taking: Reported on 10/06/2020)     No current facility-administered medications on file prior to visit.    No Known Allergies  Social History   Socioeconomic History  Marital status: Single    Spouse name: n/a   Number of children: 3   Years of education: college   Highest education level: Not on file  Occupational History   Occupation: Childcare  Tobacco Use   Smoking status: Former    Current packs/day: 0.00    Types: Cigarettes    Quit date: 09/30/2017    Years since quitting: 5.9   Smokeless tobacco: Never   Tobacco comments:    Quit in 2019  Vaping Use   Vaping status: Never Used  Substance and Sexual Activity   Alcohol use: Yes    Alcohol/week: 0.0 standard drinks of alcohol    Comment: every other weekend   Drug use: No   Sexual activity: Yes    Partners: Male    Birth control/protection:  Surgical, Condom    Comment: inconsistent condom use  Other Topics Concern   Not on file  Social History Narrative   Lives with her 3 children.   Social Drivers of Corporate investment banker Strain: Not on file  Food Insecurity: Not on file  Transportation Needs: Not on file  Physical Activity: Not on file  Stress: Not on file  Social Connections: Not on file  Intimate Partner Violence: Not on file    Family History  Problem Relation Age of Onset   Breast cancer Mother 56   Cancer Mother        Breast,Liver   Cancer Sister        cervical   Thyroid  disease Maternal Grandmother    Asthma Son     The following portions of the patient's history were reviewed and updated as appropriate: allergies, current medications, past family history, past medical history, past social history, past surgical history and problem list.  Review of Systems Pertinent items noted in HPI and remainder of comprehensive ROS otherwise negative.   Objective:  BP (!) 163/109   Pulse 87   Ht 5\' 4"  (1.626 m)   Wt 157 lb (71.2 kg)   LMP 08/21/2023   BMI 26.95 kg/m  CONSTITUTIONAL: Well-developed, well-nourished female in no acute distress.  SKIN: Skin is warm and dry. Not diaphoretic. No erythema. No pallor. NEUROLGIC: Alert and oriented to person, place, and time. PSYCHIATRIC: Normal mood and affect. Normal behavior. Normal judgment and thought content. CARDIOVASCULAR: Normal heart rate noted, regular rhythm RESPIRATORY: Clear to auscultation bilaterally. Effort and breath sounds normal, no problems with respiration noted. BREASTS: Symmetric in size. No masses, skin changes, nipple drainage, or lymphadenopathy.  PELVIC: Normal appearing external genitalia; normal appearing vaginal mucosa and cervix.  No abnormal discharge noted.  Pap smear obtained.  Normal uterine size, no other palpable masses, no uterine or adnexal tenderness.  Assessment:  Annual gynecologic examination with pap smear was  completed. A breast exam was also completed. Patient was counseled on the important of mammogram screening. Patient denied receiving mammogram screening, although she has a notable family history of breast cancer in her mother. Patient reports that she has been experiencing mood changes, sleep disturbances, heat flashes, and night sweats; the patient reports that she is still having regular menstrual cycles.She also reports that she has been experiencing vaginal issues since completing a recent course of antibiotics. Differential diagnosis includes perimenopausal transition, vulvovaginal candidiasis, UTI, and psychological factors. Perimenopausal transition likely due to her symptoms of night sweats, hot flashes, and mood/sleep disturbances. Vulvovaginal candidiasis is likely due to her recent course of oral antibiotics and acute onset vaginal itching. UTI is less  likely but still plausible due to her symptoms of vaginal itching/irritation. Psychological factors affecting sleep is plausible due to dealing with life stressors such as the passing of her son and no longer having her own housing.  Plan:  Will follow up results of pap smear and manage accordingly. FSH was ordered to help assess perimenopausal transition. Norethindrone-Ethinyl Estradiol pills were initiated to manage vasomotor symptoms and regulate cycles.  Urine analysis and CBC were ordered to evaluate for UTI and systemic infection to further assess general health.  Will follow up laboratory results accordingly, monitor response to OCPs, and reassess vaginal symptoms.  Routine preventative health maintenance measures emphasized. Please refer to After Visit Summary for other counseling recommendations.   Arlyn Lambing, Medical Student  [employer]   Attestation of Attending Supervision of Medical Student: Evaluation and management procedures were performed by the medical student under my supervision and collaboration.  I have  reviewed the student's note and chart, and I agree with the management and plan.  Onnie Bilis, MD, FACOG Attending Obstetrician & Gynecologist Faculty Practice, [facility]

## 2023-08-29 LAB — CERVICOVAGINAL ANCILLARY ONLY
Bacterial Vaginitis (gardnerella): NEGATIVE
Candida Glabrata: NEGATIVE
Candida Vaginitis: NEGATIVE
Comment: NEGATIVE
Comment: NEGATIVE
Comment: NEGATIVE

## 2023-08-31 LAB — CYTOLOGY - PAP
Adequacy: ABSENT
Comment: NEGATIVE
Diagnosis: NEGATIVE
High risk HPV: NEGATIVE

## 2024-04-22 ENCOUNTER — Encounter: Payer: Self-pay | Admitting: *Deleted

## 2024-04-22 ENCOUNTER — Ambulatory Visit
Admission: EM | Admit: 2024-04-22 | Discharge: 2024-04-22 | Disposition: A | Discharge status: Home / Self Care | Attending: Student | Admitting: Student

## 2024-04-22 DIAGNOSIS — N760 Acute vaginitis: Secondary | ICD-10-CM

## 2024-04-22 DIAGNOSIS — J069 Acute upper respiratory infection, unspecified: Secondary | ICD-10-CM | POA: Diagnosis not present

## 2024-04-22 LAB — POCT URINE DIPSTICK
Blood, UA: NEGATIVE
Glucose, UA: NEGATIVE mg/dL
Leukocytes, UA: NEGATIVE
Nitrite, UA: NEGATIVE
Spec Grav, UA: 1.025
Urobilinogen, UA: 1 U/dL
pH, UA: 6.5

## 2024-04-22 LAB — POCT RAPID STREP A (OFFICE): Rapid Strep A Screen: NEGATIVE

## 2024-04-22 LAB — POCT URINE PREGNANCY: Preg Test, Ur: NEGATIVE

## 2024-04-22 MED ORDER — PREDNISONE 20 MG PO TABS: 40.0000 mg | ORAL_TABLET | Freq: Every day | ORAL | 0 refills | Status: AC
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
## 2024-04-22 NOTE — Discharge Instructions (Addendum)
-  Prednisone , 2 pills taken at the same time for 5 days in a row.  Try taking this earlier in the day as it can give you energy. Avoid NSAIDs like ibuprofen  and alleve while taking this medication as they can increase your risk of stomach upset and even GI bleeding when in combination with a steroid. You can continue tylenol  (acetaminophen ) up to 1000mg  3x daily. -Albuterol  inhaler that you have at home already as needed for cough, wheezing, shortness of breath, 1 to 2 puffs every 6 hours as needed. - Your strep test was negative.  You have a cold. -We will call with any positive results in about 3 business days and can send treatment if necessary. -We will not call with negative lab results. -Lab results will automatically go to your MyChart. -Your urine test was normal.  You need to follow-up with your primary care provider for your ongoing urinary symptoms.

## 2024-04-22 NOTE — ED Triage Notes (Signed)
 Pt reports urinary frequency and urgency off and on for a few months- states she hasn't been able to get in to see her PCP recently- has had negative UA in the past.   Pt reports sore throat x 1 week. Concerned for possible strep. Also has a cough and laryngitis

## 2024-04-23 LAB — CERVICOVAGINAL ANCILLARY ONLY
Bacterial Vaginitis (gardnerella): NEGATIVE
Candida Glabrata: NEGATIVE
Candida Vaginitis: POSITIVE — AB
Chlamydia: NEGATIVE
Comment: NEGATIVE
Comment: NEGATIVE
Comment: NEGATIVE
Comment: NEGATIVE
Comment: NEGATIVE
Comment: NORMAL
Neisseria Gonorrhea: NEGATIVE
Trichomonas: NEGATIVE

## 2024-04-23 LAB — URINE CULTURE: Culture: NO GROWTH

## 2024-04-24 ENCOUNTER — Ambulatory Visit (HOSPITAL_COMMUNITY): Payer: Self-pay

## 2024-04-24 MED ORDER — FLUCONAZOLE 150 MG PO TABS: 150.0000 mg | ORAL_TABLET | Freq: Once | ORAL | 0 refills | Status: AC

## 2024-04-25 LAB — CULTURE, GROUP A STREP (THRC)
